# Patient Record
Sex: Female | Born: 1940
Health system: Southern US, Community
[De-identification: ages and names within clinical notes are randomized; demographics above are authoritative.]

## PROBLEM LIST (undated history)

## (undated) DIAGNOSIS — M5134 Other intervertebral disc degeneration, thoracic region: Secondary | ICD-10-CM

## (undated) DIAGNOSIS — M810 Age-related osteoporosis without current pathological fracture: Secondary | ICD-10-CM

## (undated) DIAGNOSIS — R011 Cardiac murmur, unspecified: Secondary | ICD-10-CM

## (undated) DIAGNOSIS — E041 Nontoxic single thyroid nodule: Secondary | ICD-10-CM

## (undated) HISTORY — DX: Age-related osteoporosis without current pathological fracture: M81.0

## (undated) HISTORY — PX: COLONOSCOPY: SHX174

## (undated) HISTORY — DX: Other intervertebral disc degeneration, thoracic region: M51.34

## (undated) HISTORY — DX: Nontoxic single thyroid nodule: E04.1

## (undated) HISTORY — PX: CATARACT EXTRACTION: SUR2

## (undated) HISTORY — DX: Cardiac murmur, unspecified: R01.1

## (undated) HISTORY — PX: OTHER SURGICAL HISTORY: SHX169

---

## 1998-05-16 ENCOUNTER — Other Ambulatory Visit: Admission: RE | Admit: 1998-05-16 | Discharge: 1998-05-16 | Payer: Self-pay | Admitting: Obstetrics and Gynecology

## 2001-07-22 ENCOUNTER — Other Ambulatory Visit: Admission: RE | Admit: 2001-07-22 | Discharge: 2001-07-22 | Payer: Self-pay | Admitting: Obstetrics and Gynecology

## 2004-01-12 ENCOUNTER — Other Ambulatory Visit: Admission: RE | Admit: 2004-01-12 | Discharge: 2004-01-12 | Payer: Self-pay | Admitting: Obstetrics and Gynecology

## 2005-05-16 ENCOUNTER — Other Ambulatory Visit: Admission: RE | Admit: 2005-05-16 | Discharge: 2005-05-16 | Payer: Self-pay | Admitting: Obstetrics and Gynecology

## 2007-12-15 ENCOUNTER — Encounter: Admission: RE | Admit: 2007-12-15 | Discharge: 2007-12-15 | Payer: Self-pay | Admitting: Obstetrics and Gynecology

## 2007-12-15 IMAGING — MG MM DIAGNOSTIC UNILATERAL L
2 series · 2 of 2 positions shown · non-contrast
Comparison: none

DG DIAGNOSTIC UNILATERAL L
CC and MLO view(s) were taken of the left breast.

LEFT BREAST ULTRASOUND
DIGITAL UNILATERAL LEFT DIAGNOSTIC MAMMOGRAM AND LEFT BREAST ULTRASOUND:
CLINICAL DATA: Patient returns after screening study for evaluation of the left breast.

[L CC]
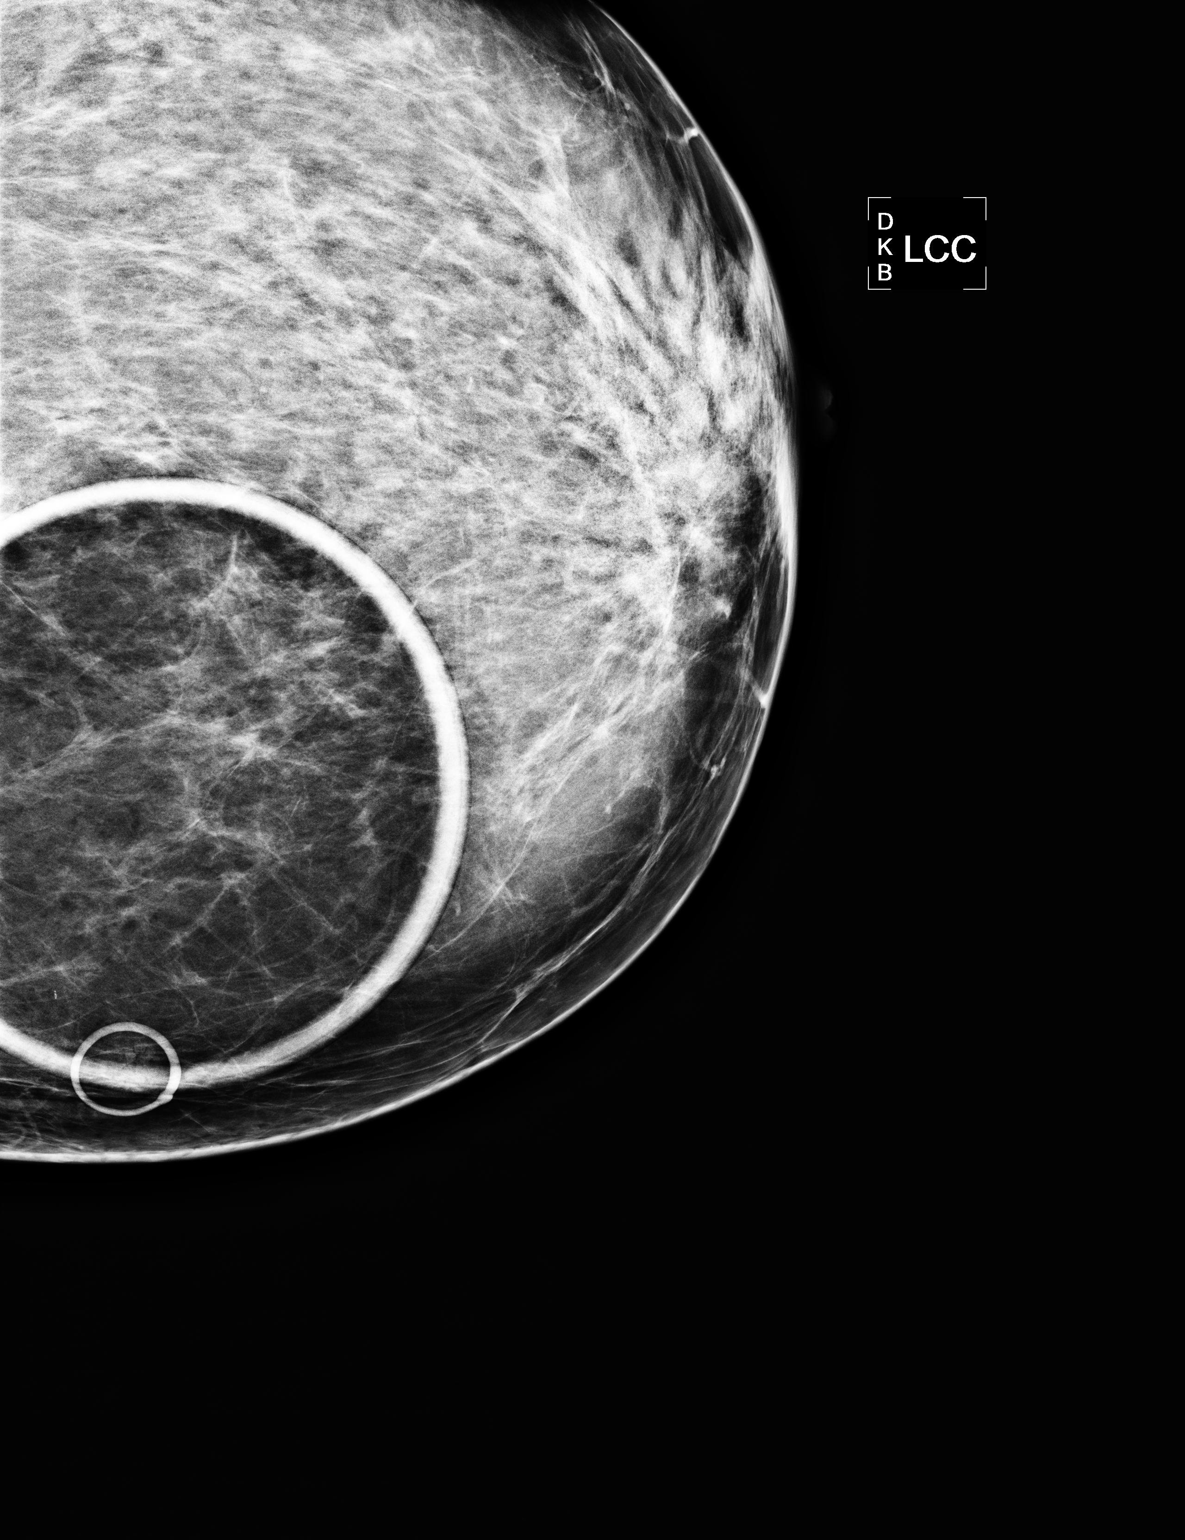

[L RM]
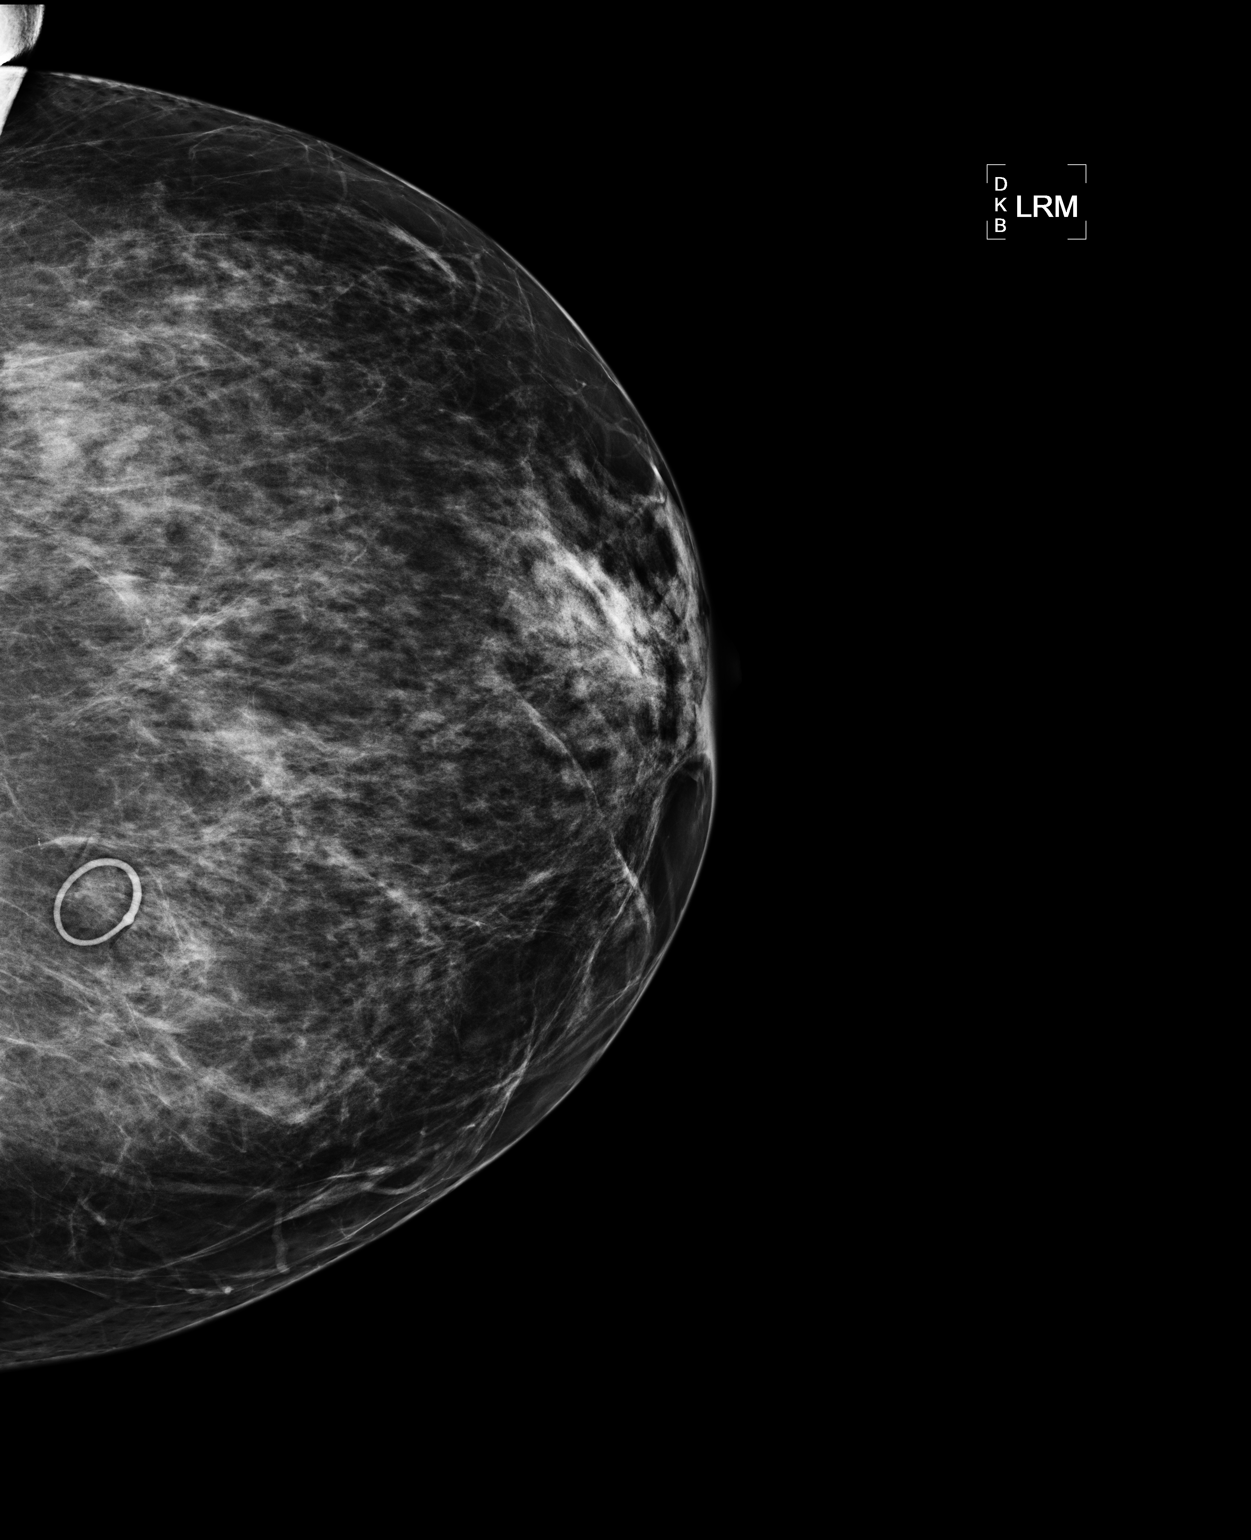

[2 of 2 positions shown; findings below may reference images not displayed]

Additional views of the left breast demonstrate fibrofatty tissue in the medial portion of the left
breast.  No persistent mass or distortion is identified.

On physical exam, I do not palpate an abnormality in the medial aspect of the left breast.  
Ultrasound of the medial quadrants is unremarkable.
IMPRESSION: No ultrasound or mammographic evidence for malignancy.  Annual mammography is recommended

ASSESSMENT: Negative - BI-RADS 1

Screening mammogram of both breasts in 1 year.
,

## 2013-09-23 DIAGNOSIS — M5126 Other intervertebral disc displacement, lumbar region: Secondary | ICD-10-CM | POA: Insufficient documentation

## 2015-02-28 DIAGNOSIS — M51379 Other intervertebral disc degeneration, lumbosacral region without mention of lumbar back pain or lower extremity pain: Secondary | ICD-10-CM | POA: Insufficient documentation

## 2015-09-28 DIAGNOSIS — M533 Sacrococcygeal disorders, not elsewhere classified: Secondary | ICD-10-CM | POA: Insufficient documentation

## 2015-11-22 ENCOUNTER — Other Ambulatory Visit: Payer: Self-pay

## 2015-11-22 DIAGNOSIS — R928 Other abnormal and inconclusive findings on diagnostic imaging of breast: Secondary | ICD-10-CM

## 2015-12-11 ENCOUNTER — Ambulatory Visit
Admission: RE | Admit: 2015-12-11 | Discharge: 2015-12-11 | Disposition: A | Discharge status: Home / Self Care | Payer: PPO | Source: Ambulatory Visit

## 2015-12-11 DIAGNOSIS — Z1231 Encounter for screening mammogram for malignant neoplasm of breast: Secondary | ICD-10-CM | POA: Diagnosis not present

## 2015-12-11 DIAGNOSIS — R928 Other abnormal and inconclusive findings on diagnostic imaging of breast: Secondary | ICD-10-CM

## 2016-01-22 DIAGNOSIS — M79641 Pain in right hand: Secondary | ICD-10-CM | POA: Diagnosis not present

## 2016-01-22 DIAGNOSIS — M1831 Unilateral post-traumatic osteoarthritis of first carpometacarpal joint, right hand: Secondary | ICD-10-CM | POA: Insufficient documentation

## 2016-02-13 DIAGNOSIS — M19079 Primary osteoarthritis, unspecified ankle and foot: Secondary | ICD-10-CM | POA: Diagnosis not present

## 2016-02-16 DIAGNOSIS — M19079 Primary osteoarthritis, unspecified ankle and foot: Secondary | ICD-10-CM | POA: Diagnosis not present

## 2016-02-16 DIAGNOSIS — M25674 Stiffness of right foot, not elsewhere classified: Secondary | ICD-10-CM | POA: Diagnosis not present

## 2016-02-16 DIAGNOSIS — M79671 Pain in right foot: Secondary | ICD-10-CM | POA: Diagnosis not present

## 2016-02-16 DIAGNOSIS — R2689 Other abnormalities of gait and mobility: Secondary | ICD-10-CM | POA: Diagnosis not present

## 2016-02-19 DIAGNOSIS — M79671 Pain in right foot: Secondary | ICD-10-CM | POA: Diagnosis not present

## 2016-02-19 DIAGNOSIS — M19079 Primary osteoarthritis, unspecified ankle and foot: Secondary | ICD-10-CM | POA: Diagnosis not present

## 2016-02-19 DIAGNOSIS — M25674 Stiffness of right foot, not elsewhere classified: Secondary | ICD-10-CM | POA: Diagnosis not present

## 2016-02-19 DIAGNOSIS — R2689 Other abnormalities of gait and mobility: Secondary | ICD-10-CM | POA: Diagnosis not present

## 2016-02-21 DIAGNOSIS — R2689 Other abnormalities of gait and mobility: Secondary | ICD-10-CM | POA: Diagnosis not present

## 2016-02-21 DIAGNOSIS — M19079 Primary osteoarthritis, unspecified ankle and foot: Secondary | ICD-10-CM | POA: Diagnosis not present

## 2016-02-21 DIAGNOSIS — M79671 Pain in right foot: Secondary | ICD-10-CM | POA: Diagnosis not present

## 2016-02-21 DIAGNOSIS — M25674 Stiffness of right foot, not elsewhere classified: Secondary | ICD-10-CM | POA: Diagnosis not present

## 2016-02-26 DIAGNOSIS — M19079 Primary osteoarthritis, unspecified ankle and foot: Secondary | ICD-10-CM | POA: Diagnosis not present

## 2016-02-26 DIAGNOSIS — R2689 Other abnormalities of gait and mobility: Secondary | ICD-10-CM | POA: Diagnosis not present

## 2016-02-26 DIAGNOSIS — M25674 Stiffness of right foot, not elsewhere classified: Secondary | ICD-10-CM | POA: Diagnosis not present

## 2016-02-26 DIAGNOSIS — M79671 Pain in right foot: Secondary | ICD-10-CM | POA: Diagnosis not present

## 2016-03-04 DIAGNOSIS — M79671 Pain in right foot: Secondary | ICD-10-CM | POA: Diagnosis not present

## 2016-03-04 DIAGNOSIS — M19079 Primary osteoarthritis, unspecified ankle and foot: Secondary | ICD-10-CM | POA: Diagnosis not present

## 2016-03-04 DIAGNOSIS — M25674 Stiffness of right foot, not elsewhere classified: Secondary | ICD-10-CM | POA: Diagnosis not present

## 2016-03-04 DIAGNOSIS — R2689 Other abnormalities of gait and mobility: Secondary | ICD-10-CM | POA: Diagnosis not present

## 2016-03-12 DIAGNOSIS — M79671 Pain in right foot: Secondary | ICD-10-CM | POA: Diagnosis not present

## 2016-03-12 DIAGNOSIS — M19079 Primary osteoarthritis, unspecified ankle and foot: Secondary | ICD-10-CM | POA: Diagnosis not present

## 2016-03-12 DIAGNOSIS — R2689 Other abnormalities of gait and mobility: Secondary | ICD-10-CM | POA: Diagnosis not present

## 2016-03-12 DIAGNOSIS — M25674 Stiffness of right foot, not elsewhere classified: Secondary | ICD-10-CM | POA: Diagnosis not present

## 2016-03-19 DIAGNOSIS — M25674 Stiffness of right foot, not elsewhere classified: Secondary | ICD-10-CM | POA: Diagnosis not present

## 2016-03-19 DIAGNOSIS — M79671 Pain in right foot: Secondary | ICD-10-CM | POA: Diagnosis not present

## 2016-03-19 DIAGNOSIS — M19079 Primary osteoarthritis, unspecified ankle and foot: Secondary | ICD-10-CM | POA: Diagnosis not present

## 2016-03-19 DIAGNOSIS — R2689 Other abnormalities of gait and mobility: Secondary | ICD-10-CM | POA: Diagnosis not present

## 2016-03-25 DIAGNOSIS — R2689 Other abnormalities of gait and mobility: Secondary | ICD-10-CM | POA: Diagnosis not present

## 2016-03-25 DIAGNOSIS — M79671 Pain in right foot: Secondary | ICD-10-CM | POA: Diagnosis not present

## 2016-03-25 DIAGNOSIS — M25674 Stiffness of right foot, not elsewhere classified: Secondary | ICD-10-CM | POA: Diagnosis not present

## 2016-03-25 DIAGNOSIS — M19079 Primary osteoarthritis, unspecified ankle and foot: Secondary | ICD-10-CM | POA: Diagnosis not present

## 2016-04-05 DIAGNOSIS — R2689 Other abnormalities of gait and mobility: Secondary | ICD-10-CM | POA: Diagnosis not present

## 2016-04-05 DIAGNOSIS — M19079 Primary osteoarthritis, unspecified ankle and foot: Secondary | ICD-10-CM | POA: Diagnosis not present

## 2016-04-05 DIAGNOSIS — M79671 Pain in right foot: Secondary | ICD-10-CM | POA: Diagnosis not present

## 2016-04-05 DIAGNOSIS — M25674 Stiffness of right foot, not elsewhere classified: Secondary | ICD-10-CM | POA: Diagnosis not present

## 2016-09-02 DIAGNOSIS — Z0000 Encounter for general adult medical examination without abnormal findings: Secondary | ICD-10-CM | POA: Diagnosis not present

## 2016-09-02 DIAGNOSIS — E042 Nontoxic multinodular goiter: Secondary | ICD-10-CM | POA: Diagnosis not present

## 2016-09-02 DIAGNOSIS — M810 Age-related osteoporosis without current pathological fracture: Secondary | ICD-10-CM | POA: Diagnosis not present

## 2016-10-16 DIAGNOSIS — H524 Presbyopia: Secondary | ICD-10-CM | POA: Diagnosis not present

## 2016-10-16 DIAGNOSIS — H2513 Age-related nuclear cataract, bilateral: Secondary | ICD-10-CM | POA: Diagnosis not present

## 2016-11-28 DIAGNOSIS — S22000A Wedge compression fracture of unspecified thoracic vertebra, initial encounter for closed fracture: Secondary | ICD-10-CM | POA: Diagnosis not present

## 2016-12-11 ENCOUNTER — Other Ambulatory Visit: Payer: Self-pay | Admitting: Neurosurgery

## 2016-12-11 DIAGNOSIS — S22000A Wedge compression fracture of unspecified thoracic vertebra, initial encounter for closed fracture: Secondary | ICD-10-CM

## 2016-12-16 ENCOUNTER — Ambulatory Visit
Admission: RE | Admit: 2016-12-16 | Discharge: 2016-12-16 | Disposition: A | Discharge status: Home / Self Care | Payer: PPO | Source: Ambulatory Visit | Attending: Neurosurgery | Admitting: Neurosurgery

## 2016-12-16 DIAGNOSIS — M545 Low back pain: Secondary | ICD-10-CM | POA: Diagnosis not present

## 2016-12-16 DIAGNOSIS — S22000A Wedge compression fracture of unspecified thoracic vertebra, initial encounter for closed fracture: Secondary | ICD-10-CM

## 2016-12-16 DIAGNOSIS — S22080A Wedge compression fracture of T11-T12 vertebra, initial encounter for closed fracture: Secondary | ICD-10-CM | POA: Diagnosis not present

## 2016-12-16 IMAGING — CT CT T SPINE W/O CM
1 series · 12 of 14 positions shown, 15 images · non-contrast
Comparison: Radiographs dated [DATE]

CLINICAL DATA: Back pain.  Compression fracture of T11.

EXAM:
CT THORACIC SPINE WITHOUT CONTRAST
TECHNIQUE: Multidetector CT images of the thoracic were obtained using the
standard protocol without intravenous contrast.

[Series 3: t spine soft · axial · 0.29mm/px · z∈[-319,-43]mm · 12 of 110 slices shown, 15 images]
[im 9/110  soft-tissue]
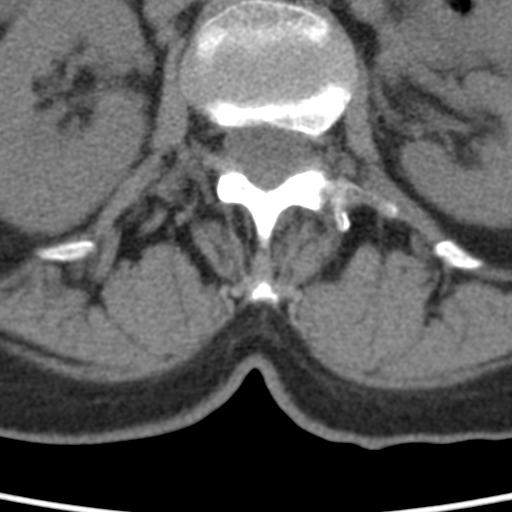
[im 9/110  bone]
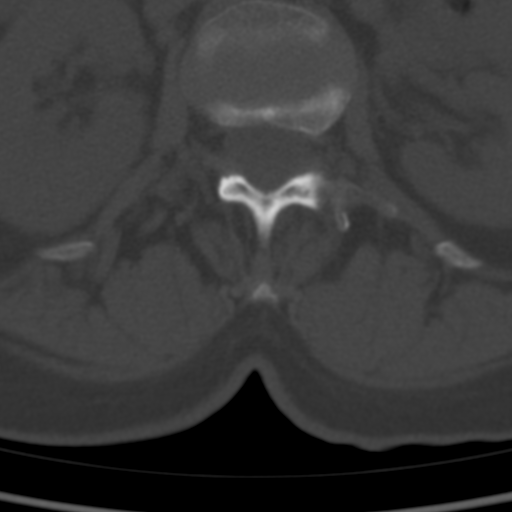
[im 17/110  bone]
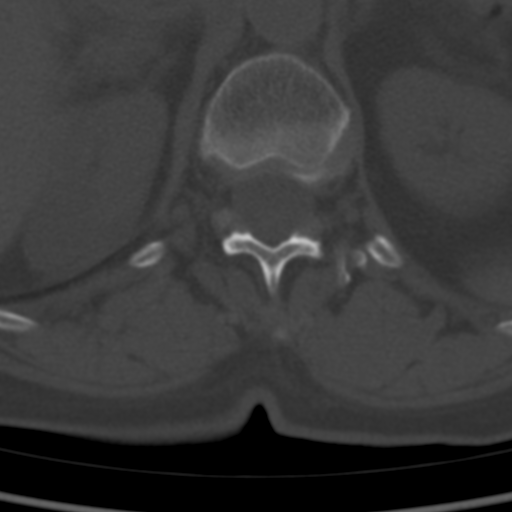
[im 26/110  bone]
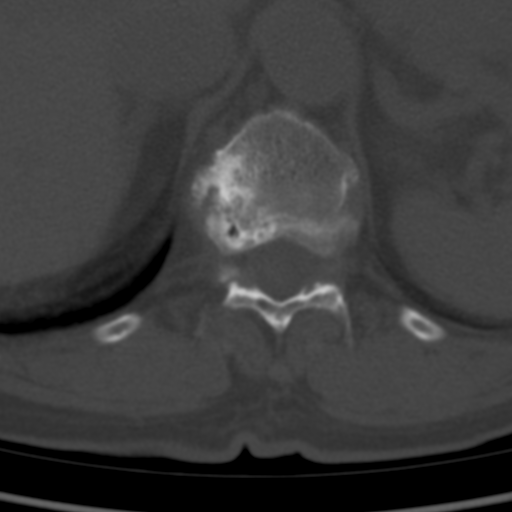
[im 34/110  bone]
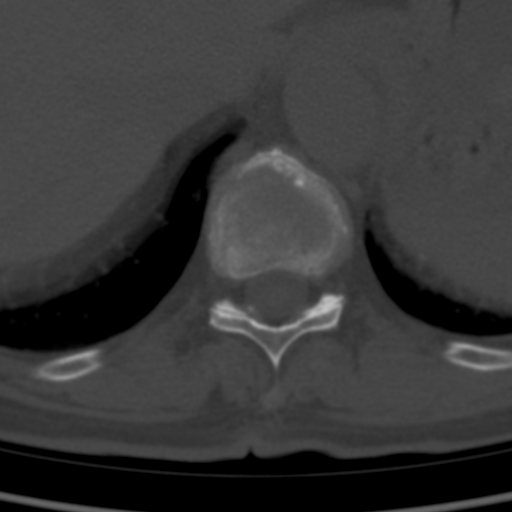
[im 42/110  soft-tissue]
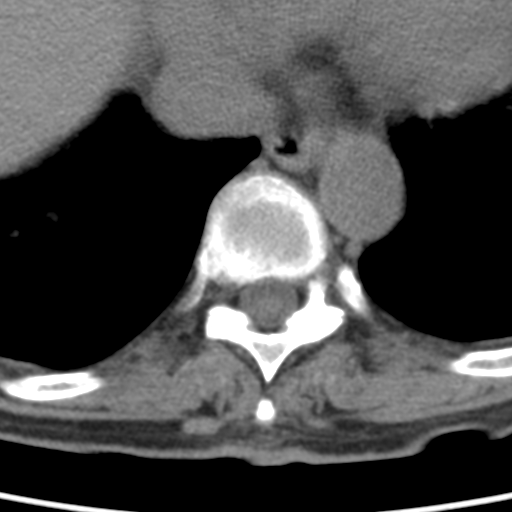
[im 42/110  bone]
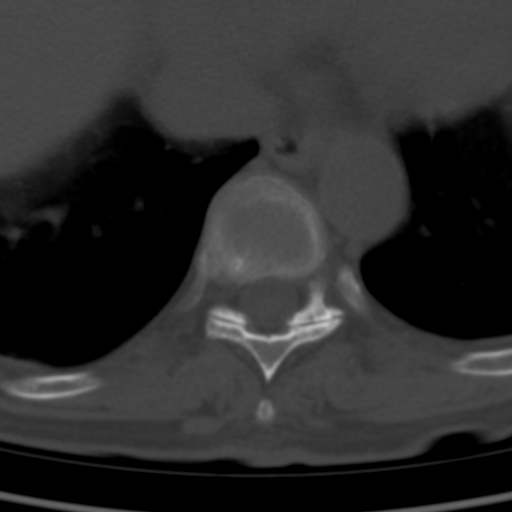
[im 51/110  bone]
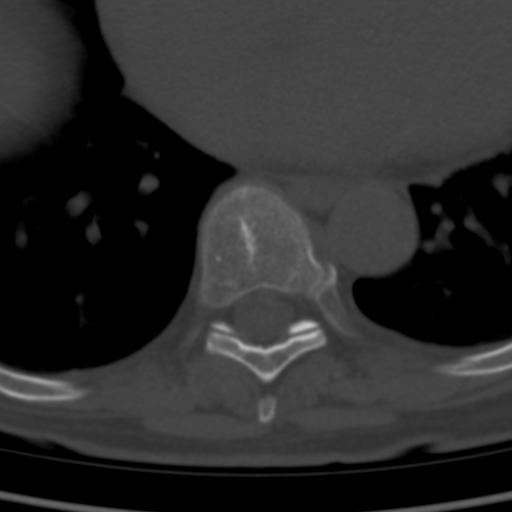
[im 59/110  bone]
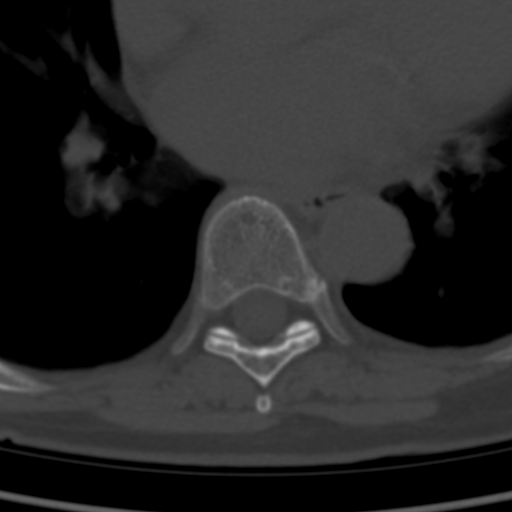
[im 68/110  bone]
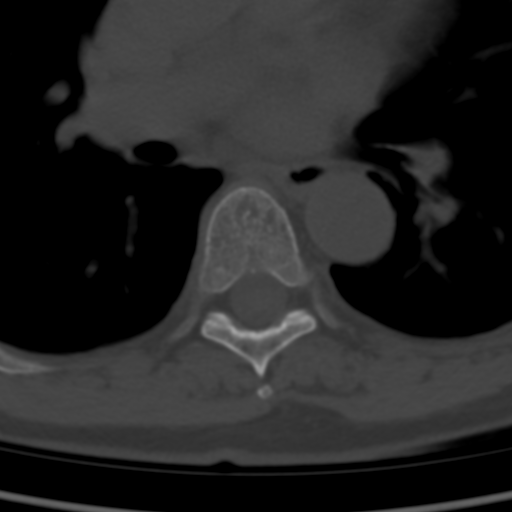
[im 76/110  soft-tissue]
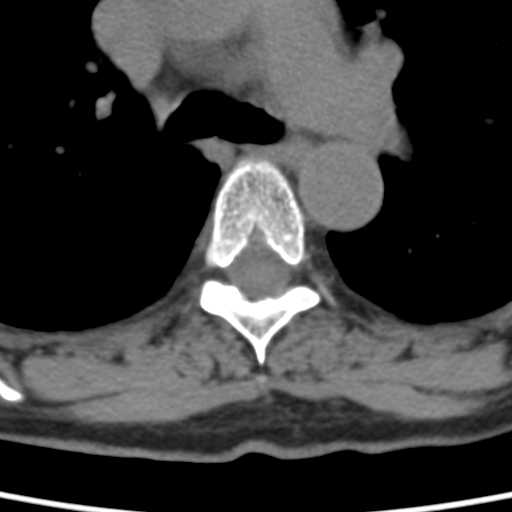
[im 76/110  bone]
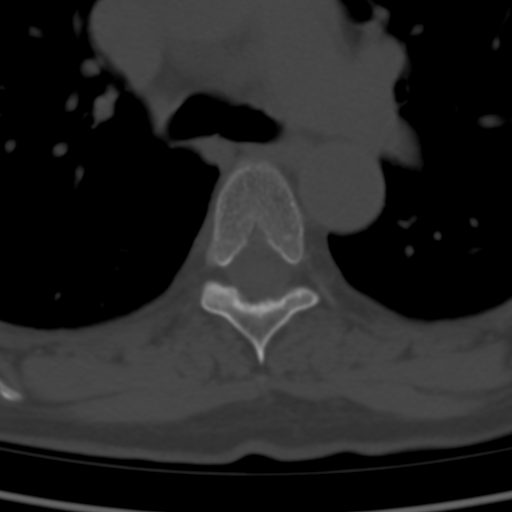
[im 84/110  bone]
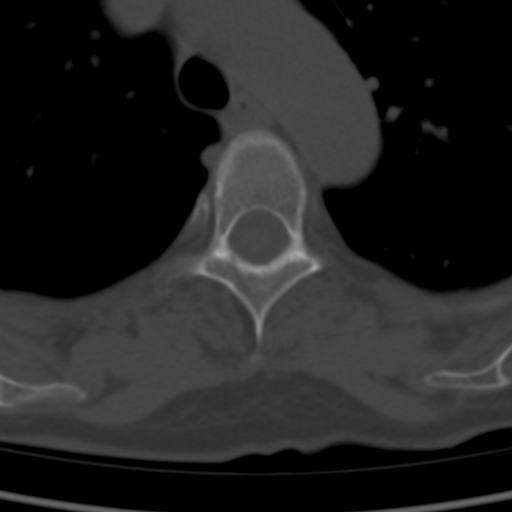
[im 93/110  bone]
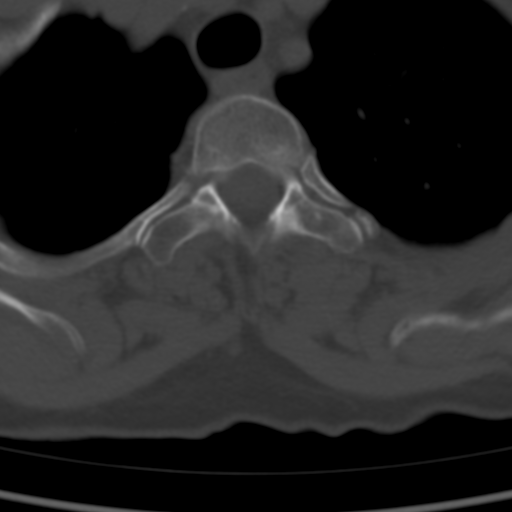
[im 101/110  bone]
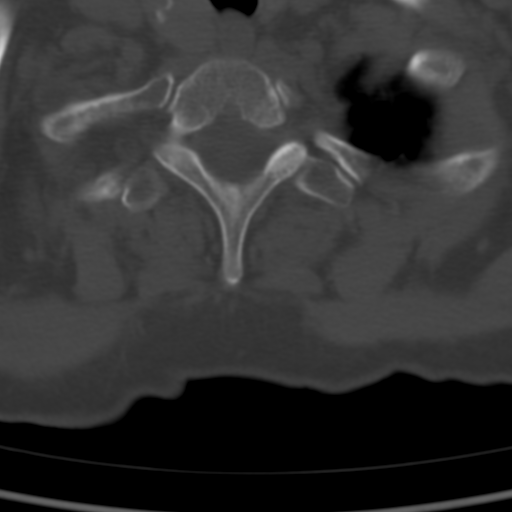

[12 of 14 positions shown; findings below may reference images not displayed]

FINDINGS: Alignment: Slight retrolisthesis of T11 on T12, 2.5 mm.

Vertebrae: Old compression fractures T11, unchanged since
radiographs of [DATE]. Severe degenerative disc disease with
erosions of the vertebral endplates at T11-12 asymmetric to the
right of midline.

Paraspinal and other soft tissues: Normal.

Disc levels: T1-2 through T4-5: Normal.

C5-6: Small central disc protrusion with no neural impingement.

T6-7:  Small central disc protrusion with no neural impingement.

T7-8: Tiny central disc bulge with no neural impingement.

T9-10: Prominent disc extrusion central and slightly asymmetric to
the left slightly distorting the ventral aspect of the left side of
the spinal cord. No spinal stenosis.

T9-10:  Normal.

T10-11: Old compression fracture of the superior aspect of T11. No
disc protrusion. Slight protrusion of the posterosuperior aspect of
the T11 vertebral body into the spinal canal, 2 mm. No neural
impingement.

T11-12: Severe degenerative disc disease with extensive degenerative
changes of the vertebral endplates asymmetric to the right. 2.5 mm
retrolisthesis of T11 on T12. No disc protrusion or bulging.

T12-L1 and L1-2:  Negative.
IMPRESSION: 1. Old healed compression fracture of T11.
2. Severe degenerative disc disease at T11-12 without neural
impingement.
3. Soft disc extrusion at T8-9 central and slightly to the left
slightly indenting the ventral aspect of the spinal cord at that
level.
4. Tiny central disc protrusions at T5-6 and T6-7.

## 2016-12-16 IMAGING — CT CT L SPINE W/O CM
1 of 6 series · 6 of 14 positions shown, 8 images · non-contrast
Comparison: Radiographs dated [DATE]

CLINICAL DATA: Low back pain for several years. T11 compression
fracture.

EXAM:
CT LUMBAR SPINE WITHOUT CONTRAST
TECHNIQUE: Multidetector CT imaging of the lumbar spine was performed without
intravenous contrast administration. Multiplanar CT image
reconstructions were also generated.

[Series 4: l spine bone · axial · 0.30mm/px · z∈[-459,-294]mm · 6 of 79 slices shown, 8 images]
[im 12/79  soft-tissue]
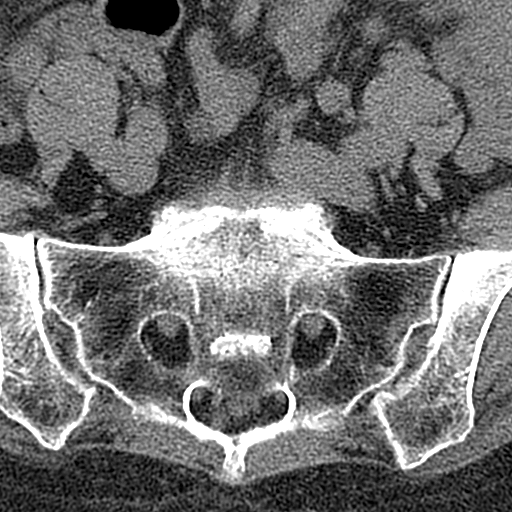
[im 12/79  bone]
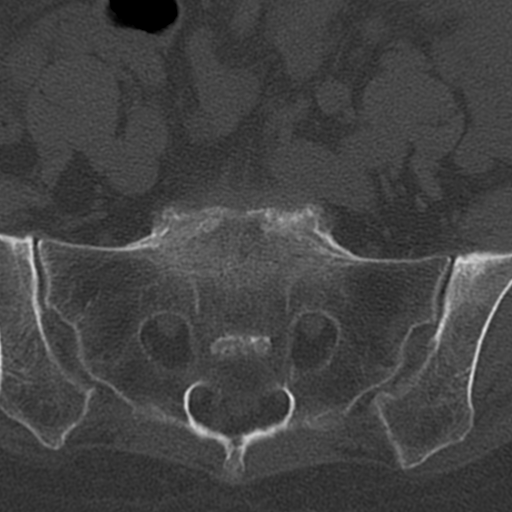
[im 23/79  bone]
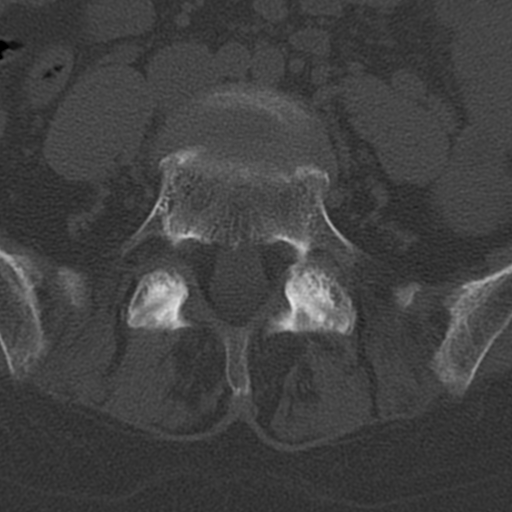
[im 34/79  bone]
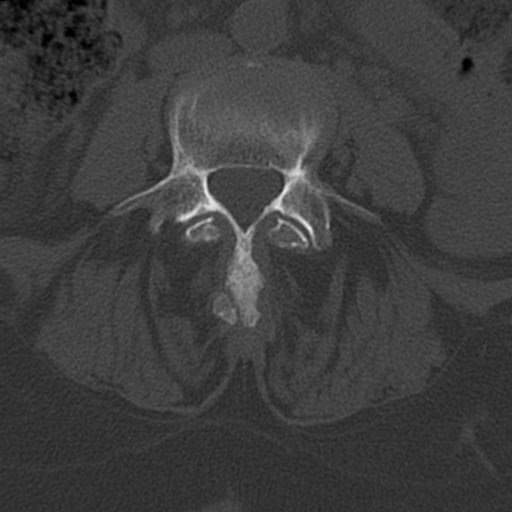
[im 45/79  bone]
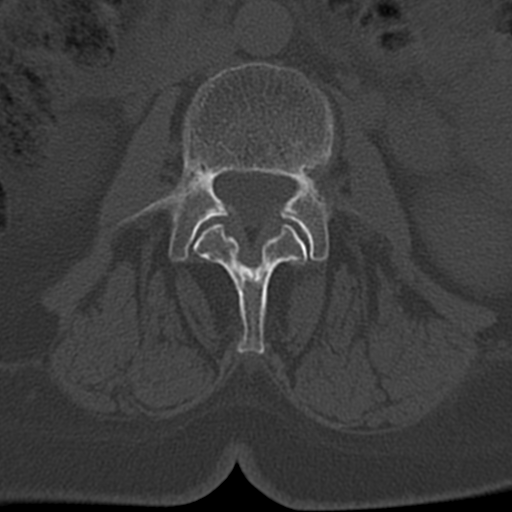
[im 56/79  soft-tissue]
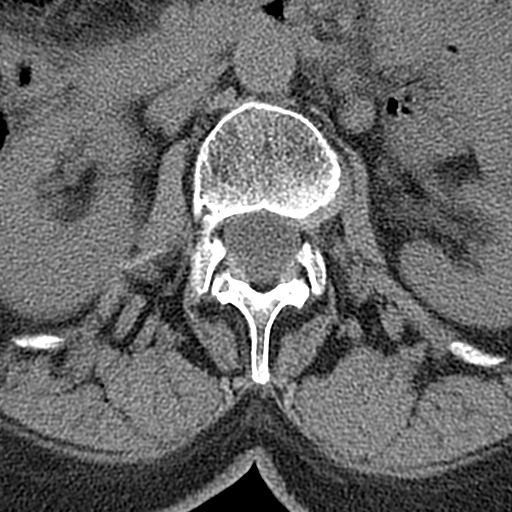
[im 56/79  bone]
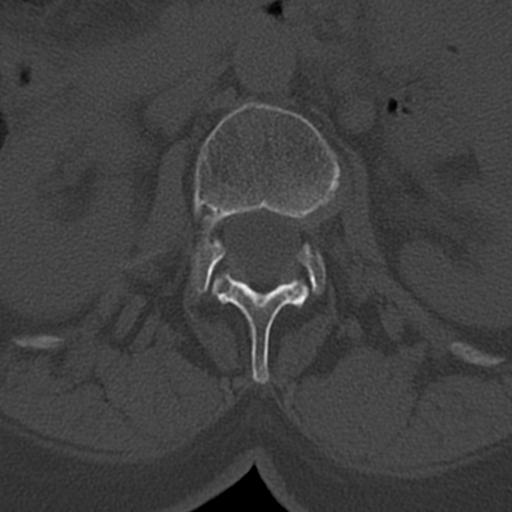
[im 67/79  bone]
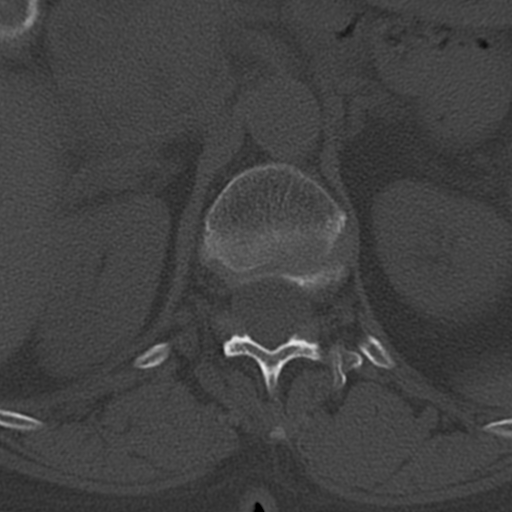

[6 of 14 positions shown; findings below may reference images not displayed]

FINDINGS: Segmentation: Normal.

Alignment: 10 mm spondylolisthesis of L4 on L5 secondary to severe
bilateral facet arthritis.

Vertebrae: No fractures or bone destruction.

Paraspinal and other soft tissues: Multiple calcified gallstones.
2.9 cm low-density lesion in the upper pole of the right kidney
consistent with a cyst.

Disc levels: T12-L1 and L1-2:  Normal.

L2-3: Normal disc. Degenerative changes between the spinous
processes of L2 and L3.

L3-4: Normal disc. Slight degenerative changes between the spinous
processes of L3 and L4.

L4-5: 1 cm spondylolisthesis with a broad-based protrusion of the
uncovered disc with severe bilateral facet arthritis with ligamentum
flavum hypertrophy. These combine to create severe spinal stenosis.
The thecal sac is almost obliterated at the level of the disc. This
is best seen on images 52 and 53. Degenerative changes between the
spinous processes of L4 and L5.

L5-S1: Severe disc degeneration with a vacuum phenomena and. Small
broad-based disc bulge with no neural impingement. Widely patent
neural foramina.
IMPRESSION: 1. Severe spinal stenosis at L4-5 secondary to grade 1
spondylolisthesis and a broad-based disc protrusion and severe
bilateral facet arthritis.
2. Degenerative changes between the spinous processes at L2-3, L3-4,
and L4-5.

## 2016-12-20 DIAGNOSIS — S22000A Wedge compression fracture of unspecified thoracic vertebra, initial encounter for closed fracture: Secondary | ICD-10-CM | POA: Diagnosis not present

## 2016-12-20 DIAGNOSIS — M4316 Spondylolisthesis, lumbar region: Secondary | ICD-10-CM | POA: Diagnosis not present

## 2016-12-20 DIAGNOSIS — M5137 Other intervertebral disc degeneration, lumbosacral region: Secondary | ICD-10-CM | POA: Diagnosis not present

## 2017-01-13 ENCOUNTER — Other Ambulatory Visit: Payer: Self-pay | Admitting: Family Medicine

## 2017-01-13 DIAGNOSIS — Z1231 Encounter for screening mammogram for malignant neoplasm of breast: Secondary | ICD-10-CM

## 2017-02-10 ENCOUNTER — Ambulatory Visit
Admission: RE | Admit: 2017-02-10 | Discharge: 2017-02-10 | Disposition: A | Discharge status: Home / Self Care | Payer: PPO | Source: Ambulatory Visit | Attending: Family Medicine | Admitting: Family Medicine

## 2017-02-10 DIAGNOSIS — Z1231 Encounter for screening mammogram for malignant neoplasm of breast: Secondary | ICD-10-CM | POA: Diagnosis not present

## 2017-02-13 DIAGNOSIS — J0100 Acute maxillary sinusitis, unspecified: Secondary | ICD-10-CM | POA: Diagnosis not present

## 2017-05-19 DIAGNOSIS — Z8041 Family history of malignant neoplasm of ovary: Secondary | ICD-10-CM | POA: Diagnosis not present

## 2017-05-19 DIAGNOSIS — Z8059 Family history of malignant neoplasm of other urinary tract organ: Secondary | ICD-10-CM | POA: Diagnosis not present

## 2017-05-19 DIAGNOSIS — Z803 Family history of malignant neoplasm of breast: Secondary | ICD-10-CM | POA: Diagnosis not present

## 2017-05-19 DIAGNOSIS — Z806 Family history of leukemia: Secondary | ICD-10-CM | POA: Diagnosis not present

## 2017-05-27 DIAGNOSIS — B355 Tinea imbricata: Secondary | ICD-10-CM | POA: Diagnosis not present

## 2017-07-21 DIAGNOSIS — J324 Chronic pansinusitis: Secondary | ICD-10-CM | POA: Diagnosis not present

## 2017-08-13 DIAGNOSIS — D1801 Hemangioma of skin and subcutaneous tissue: Secondary | ICD-10-CM | POA: Diagnosis not present

## 2017-08-13 DIAGNOSIS — L821 Other seborrheic keratosis: Secondary | ICD-10-CM | POA: Diagnosis not present

## 2017-08-13 DIAGNOSIS — D692 Other nonthrombocytopenic purpura: Secondary | ICD-10-CM | POA: Diagnosis not present

## 2017-10-20 DIAGNOSIS — Z79899 Other long term (current) drug therapy: Secondary | ICD-10-CM | POA: Diagnosis not present

## 2017-10-20 DIAGNOSIS — Z6825 Body mass index (BMI) 25.0-25.9, adult: Secondary | ICD-10-CM | POA: Diagnosis not present

## 2017-10-20 DIAGNOSIS — H5213 Myopia, bilateral: Secondary | ICD-10-CM | POA: Diagnosis not present

## 2017-10-20 DIAGNOSIS — M810 Age-related osteoporosis without current pathological fracture: Secondary | ICD-10-CM | POA: Diagnosis not present

## 2017-10-20 DIAGNOSIS — H2513 Age-related nuclear cataract, bilateral: Secondary | ICD-10-CM | POA: Diagnosis not present

## 2017-10-20 DIAGNOSIS — M545 Low back pain: Secondary | ICD-10-CM | POA: Diagnosis not present

## 2017-10-20 DIAGNOSIS — Z0000 Encounter for general adult medical examination without abnormal findings: Secondary | ICD-10-CM | POA: Diagnosis not present

## 2017-10-20 DIAGNOSIS — M549 Dorsalgia, unspecified: Secondary | ICD-10-CM | POA: Diagnosis not present

## 2017-11-16 DIAGNOSIS — R300 Dysuria: Secondary | ICD-10-CM | POA: Diagnosis not present

## 2017-11-16 DIAGNOSIS — N3090 Cystitis, unspecified without hematuria: Secondary | ICD-10-CM | POA: Diagnosis not present

## 2017-11-22 DIAGNOSIS — R109 Unspecified abdominal pain: Secondary | ICD-10-CM | POA: Diagnosis not present

## 2017-11-22 DIAGNOSIS — N200 Calculus of kidney: Secondary | ICD-10-CM | POA: Diagnosis not present

## 2017-11-28 DIAGNOSIS — N201 Calculus of ureter: Secondary | ICD-10-CM | POA: Diagnosis not present

## 2017-11-28 DIAGNOSIS — N132 Hydronephrosis with renal and ureteral calculous obstruction: Secondary | ICD-10-CM | POA: Diagnosis not present

## 2017-12-22 DIAGNOSIS — N202 Calculus of kidney with calculus of ureter: Secondary | ICD-10-CM | POA: Diagnosis not present

## 2017-12-24 ENCOUNTER — Encounter (INDEPENDENT_AMBULATORY_CARE_PROVIDER_SITE_OTHER): Payer: Self-pay | Admitting: Orthopedic Surgery

## 2017-12-24 ENCOUNTER — Ambulatory Visit (INDEPENDENT_AMBULATORY_CARE_PROVIDER_SITE_OTHER): Payer: PPO | Admitting: Orthopedic Surgery

## 2017-12-24 ENCOUNTER — Ambulatory Visit (INDEPENDENT_AMBULATORY_CARE_PROVIDER_SITE_OTHER): Payer: PPO

## 2017-12-24 DIAGNOSIS — G8929 Other chronic pain: Secondary | ICD-10-CM

## 2017-12-24 DIAGNOSIS — M25511 Pain in right shoulder: Secondary | ICD-10-CM

## 2017-12-27 ENCOUNTER — Encounter (INDEPENDENT_AMBULATORY_CARE_PROVIDER_SITE_OTHER): Payer: Self-pay | Admitting: Orthopedic Surgery

## 2017-12-27 NOTE — Progress Notes (Signed)
Office Visit Note   Patient: Stephanie Mckee           Date of Birth: 09-Nov-1941           MRN: [id number] Visit Date: 12/24/2017 Requested by: No referring provider defined for this encounter. PCP: Greig Right, MD  Subjective: Chief Complaint  Patient presents with  . Right Shoulder - Pain    HPI: [name] is a patient who presents for evaluation of bilateral shoulder pain right worse than left.  Started a year ago with water aerobics.  Patient states the pain comes and goes.  Usually depends on motion and activity.  She is right-hand dominant.  The pain does not wake her from sleep at night.  At times she reports some decreased range of motion due to the pain.  Most of the pain is in the biceps area.  She reports some radiation of the pain to the elbow but no numbness and tingling and no neck pain.  She takes Celebrex for low back pain.  She states her shoulder pain is getting a little bit worse.  He states it is a grabbing type pain with the car door.              ROS: All systems reviewed are negative as they relate to the chief complaint within the history of present illness.  Patient denies  fevers or chills.   Assessment & Plan: Visit Diagnoses:  1. Chronic right shoulder pain     Plan: Impression is right shoulder pain with evidence of early arthritis.  Ultrasound examination of the rotator cuff demonstrates no large tears.  She may have a little partial-thickness tearing of the supraspinatus but nothing large seen on ultrasound examination.  For now I think that options include treatment with injections and conservative treatment.  Eventually she may need to get further imaging if she is considering operative intervention.  I will see her back as needed.  Follow-Up Instructions: Return if symptoms worsen or fail to improve.   Orders:  Orders Placed This Encounter  Procedures  . XR Shoulder Right   No orders of the defined types were placed in this encounter.     Procedures: No procedures performed   Clinical Data: No additional findings.  Objective: Vital Signs: There were no vitals taken for this visit.  Physical Exam:   Constitutional: Patient appears well-developed HEENT:  Head: Normocephalic Eyes:EOM are normal Neck: Normal range of motion Cardiovascular: Normal rate Pulmonary/chest: Effort normal Neurologic: Patient is alert Skin: Skin is warm Psychiatric: Patient has normal mood and affect    Ortho Exam: Orthopedic exam demonstrates good cervical spine range of motion.  No paresthesias C5-T1.  Reflexes symmetric.  Radial pulse intact.  Deltoid demonstrates good function.  No restriction of external rotation at 15 degrees of abduction.  Rotator cuff strength is intact to infraspinatus supraspinatus and subscap muscle testing.  Not much in the way of coarse grinding or crepitus with passive range of motion.  Specialty Comments:  No specialty comments available.  Imaging: No results found.   PMFS History: There are no active problems to display for this patient.  History reviewed. No pertinent past medical history.  Family History  Problem Relation Age of Onset  . Breast cancer Maternal Aunt   . Breast cancer Daughter     History reviewed. No pertinent surgical history. Social History   Occupational History  . Not on file  Tobacco Use  . Smoking status: Not on file  Substance and Sexual Activity  . Alcohol use: Not on file  . Drug use: Not on file  . Sexual activity: Not on file

## 2018-03-17 ENCOUNTER — Other Ambulatory Visit: Payer: Self-pay | Admitting: Family Medicine

## 2018-03-17 DIAGNOSIS — Z1231 Encounter for screening mammogram for malignant neoplasm of breast: Secondary | ICD-10-CM

## 2018-04-06 DIAGNOSIS — S83402A Sprain of unspecified collateral ligament of left knee, initial encounter: Secondary | ICD-10-CM | POA: Diagnosis not present

## 2018-04-06 DIAGNOSIS — S80211A Abrasion, right knee, initial encounter: Secondary | ICD-10-CM | POA: Diagnosis not present

## 2018-04-07 ENCOUNTER — Ambulatory Visit
Admission: RE | Admit: 2018-04-07 | Discharge: 2018-04-07 | Disposition: A | Discharge status: Home / Self Care | Payer: PPO | Source: Ambulatory Visit | Attending: Family Medicine | Admitting: Family Medicine

## 2018-04-07 DIAGNOSIS — Z1231 Encounter for screening mammogram for malignant neoplasm of breast: Secondary | ICD-10-CM | POA: Diagnosis not present

## 2018-04-20 DIAGNOSIS — R229 Localized swelling, mass and lump, unspecified: Secondary | ICD-10-CM | POA: Diagnosis not present

## 2018-04-21 DIAGNOSIS — M4120 Other idiopathic scoliosis, site unspecified: Secondary | ICD-10-CM | POA: Diagnosis not present

## 2018-04-21 DIAGNOSIS — M533 Sacrococcygeal disorders, not elsewhere classified: Secondary | ICD-10-CM | POA: Diagnosis not present

## 2018-04-30 DIAGNOSIS — R1031 Right lower quadrant pain: Secondary | ICD-10-CM | POA: Diagnosis not present

## 2018-06-08 ENCOUNTER — Other Ambulatory Visit: Payer: Self-pay | Admitting: Neurosurgery

## 2018-06-08 DIAGNOSIS — M533 Sacrococcygeal disorders, not elsewhere classified: Secondary | ICD-10-CM

## 2018-06-18 ENCOUNTER — Ambulatory Visit
Admission: RE | Admit: 2018-06-18 | Discharge: 2018-06-18 | Disposition: A | Discharge status: Home / Self Care | Payer: PPO | Source: Ambulatory Visit | Attending: Neurosurgery | Admitting: Neurosurgery

## 2018-06-18 DIAGNOSIS — M533 Sacrococcygeal disorders, not elsewhere classified: Secondary | ICD-10-CM

## 2018-06-18 IMAGING — XA DG FLUORO GUIDE SPINAL/SI JT INJ*R*
1 series · 1 of 1 positions shown · non-contrast
Comparison: none

CLINICAL DATA: Sacroiliac joint dysfunction. Right buttock/SI
region pain. Intermittent right leg numbness.

[Series 1: ortho standard · 1 of 1 slices shown]
[im 1/1]
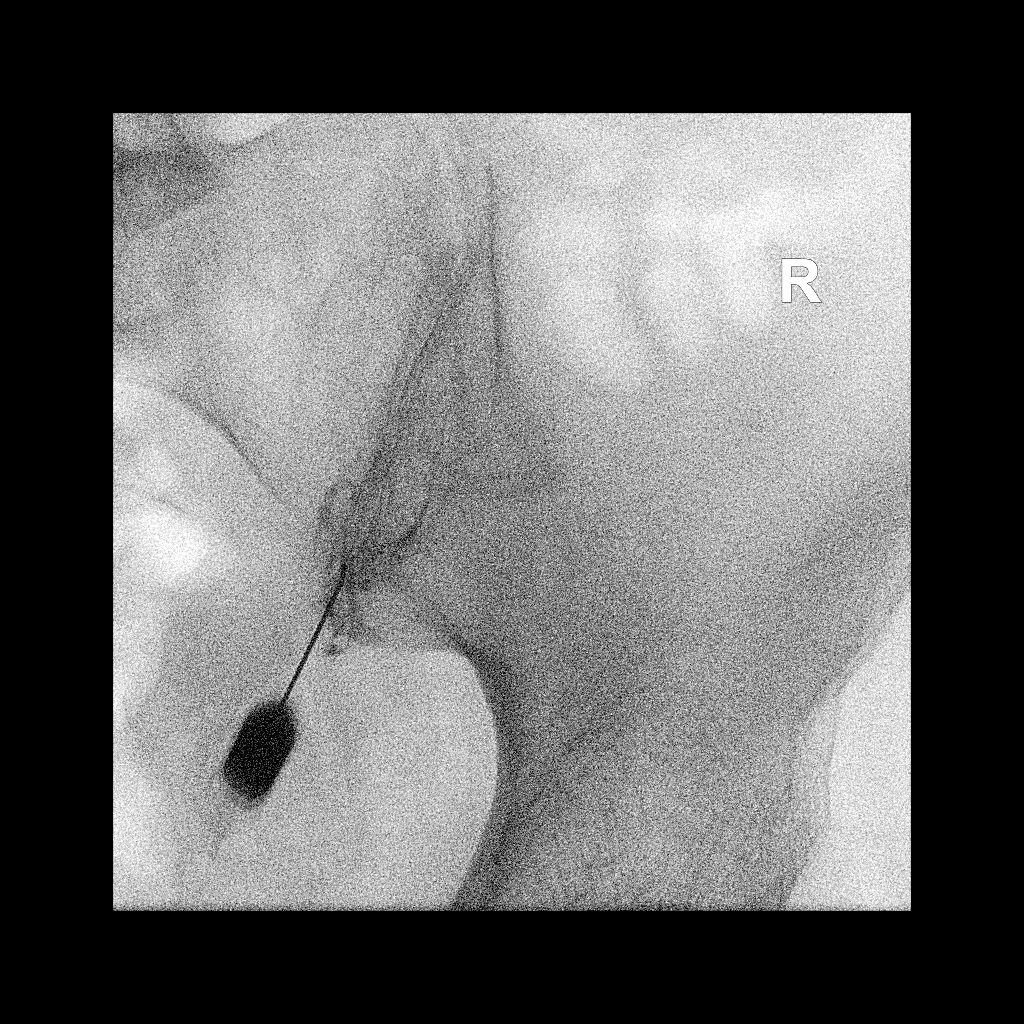

[1 of 1 positions shown; findings below may reference images not displayed]

FLUOROSCOPY TIME:  Radiation Exposure Index (as provided by the
fluoroscopic device): 23.62 microGray*m^2

Fluoroscopy Time (in minutes and seconds):  9 seconds

PROCEDURE:
FLUOROSCOPICALLY GUIDED RIGHT SI JOINT INJECTION.

After a thorough discussion of risks and benefits of the procedure,
including bleeding, infection, injury to nerves, blood vessels, and
adjacent structures, verbal and written consent was obtained.
Specific risks of the procedure included
nondiagnostic/nontherapeutic injection and nontarget injection. The
patient was placed prone on the fluoroscopy table and localization
was performed over the sacrum. Target site marked using fluoroscopic
guidance. The skin was prepped and draped in the usual sterile
fashion using Betadine soap.

After local anesthesia with 1% lidocaine without epinephrine and
subsequent deep anesthesia, a 3.5 inch 22 gauge spinal needle was
advanced into the right SI joint. Injection of 0.5 mL of Isovue-M
200 confirmed intra-articular placement. No vascular uptake was
present. Subsequently, 120 mg of Depo-Medrol and 1 mL of 1%
lidocaine were injected into the SI joint. The needle was removed
and a sterile dressing applied.

No complications were observed. The patient was observed and
released under the care of a driver after 30 minutes.

Preprocedure pain score:  [DATE]

Postprocedure pain score: [DATE]
IMPRESSION: Successful fluoroscopically guided right SI joint injection.

## 2018-06-18 MED ORDER — METHYLPREDNISOLONE ACETATE 40 MG/ML INJ SUSP (RADIOLOG
120.0000 mg | Freq: Once | INTRAMUSCULAR | Status: AC
  Administered 2018-06-18: 120 mg via INTRA_ARTICULAR

## 2018-06-18 MED ORDER — IOPAMIDOL (ISOVUE-M 200) INJECTION 41%
1.0000 mL | Freq: Once | INTRAMUSCULAR | Status: AC
  Administered 2018-06-18: 1 mL via INTRA_ARTICULAR

## 2018-06-18 NOTE — Discharge Instructions (Signed)

## 2018-08-19 DIAGNOSIS — H25811 Combined forms of age-related cataract, right eye: Secondary | ICD-10-CM | POA: Diagnosis not present

## 2018-09-03 DIAGNOSIS — H52201 Unspecified astigmatism, right eye: Secondary | ICD-10-CM | POA: Diagnosis not present

## 2018-09-03 DIAGNOSIS — H25811 Combined forms of age-related cataract, right eye: Secondary | ICD-10-CM | POA: Diagnosis not present

## 2018-09-03 DIAGNOSIS — H2511 Age-related nuclear cataract, right eye: Secondary | ICD-10-CM | POA: Diagnosis not present

## 2018-10-26 DIAGNOSIS — Z79899 Other long term (current) drug therapy: Secondary | ICD-10-CM | POA: Diagnosis not present

## 2018-10-26 DIAGNOSIS — M818 Other osteoporosis without current pathological fracture: Secondary | ICD-10-CM | POA: Diagnosis not present

## 2018-10-26 DIAGNOSIS — E7800 Pure hypercholesterolemia, unspecified: Secondary | ICD-10-CM | POA: Diagnosis not present

## 2018-10-26 DIAGNOSIS — Z6825 Body mass index (BMI) 25.0-25.9, adult: Secondary | ICD-10-CM | POA: Diagnosis not present

## 2018-10-26 DIAGNOSIS — Z0000 Encounter for general adult medical examination without abnormal findings: Secondary | ICD-10-CM | POA: Diagnosis not present

## 2018-12-09 DIAGNOSIS — L820 Inflamed seborrheic keratosis: Secondary | ICD-10-CM | POA: Diagnosis not present

## 2018-12-09 DIAGNOSIS — D044 Carcinoma in situ of skin of scalp and neck: Secondary | ICD-10-CM | POA: Diagnosis not present

## 2018-12-09 DIAGNOSIS — L821 Other seborrheic keratosis: Secondary | ICD-10-CM | POA: Diagnosis not present

## 2018-12-09 DIAGNOSIS — D485 Neoplasm of uncertain behavior of skin: Secondary | ICD-10-CM | POA: Diagnosis not present

## 2018-12-09 DIAGNOSIS — L570 Actinic keratosis: Secondary | ICD-10-CM | POA: Diagnosis not present

## 2018-12-09 DIAGNOSIS — C44629 Squamous cell carcinoma of skin of left upper limb, including shoulder: Secondary | ICD-10-CM | POA: Diagnosis not present

## 2018-12-09 DIAGNOSIS — D692 Other nonthrombocytopenic purpura: Secondary | ICD-10-CM | POA: Diagnosis not present

## 2018-12-09 DIAGNOSIS — D1801 Hemangioma of skin and subcutaneous tissue: Secondary | ICD-10-CM | POA: Diagnosis not present

## 2019-01-04 DIAGNOSIS — N281 Cyst of kidney, acquired: Secondary | ICD-10-CM | POA: Diagnosis not present

## 2019-01-05 DIAGNOSIS — Z85828 Personal history of other malignant neoplasm of skin: Secondary | ICD-10-CM | POA: Diagnosis not present

## 2019-01-05 DIAGNOSIS — L988 Other specified disorders of the skin and subcutaneous tissue: Secondary | ICD-10-CM | POA: Diagnosis not present

## 2019-01-05 DIAGNOSIS — C44629 Squamous cell carcinoma of skin of left upper limb, including shoulder: Secondary | ICD-10-CM | POA: Diagnosis not present

## 2019-01-13 DIAGNOSIS — Z85828 Personal history of other malignant neoplasm of skin: Secondary | ICD-10-CM | POA: Diagnosis not present

## 2019-01-13 DIAGNOSIS — D044 Carcinoma in situ of skin of scalp and neck: Secondary | ICD-10-CM | POA: Diagnosis not present

## 2019-03-18 DIAGNOSIS — S81811A Laceration without foreign body, right lower leg, initial encounter: Secondary | ICD-10-CM | POA: Diagnosis not present

## 2019-04-13 ENCOUNTER — Other Ambulatory Visit: Payer: Self-pay | Admitting: Family Medicine

## 2019-04-13 DIAGNOSIS — Z1231 Encounter for screening mammogram for malignant neoplasm of breast: Secondary | ICD-10-CM

## 2019-04-27 ENCOUNTER — Other Ambulatory Visit: Payer: Self-pay | Admitting: Family Medicine

## 2019-04-27 DIAGNOSIS — M8589 Other specified disorders of bone density and structure, multiple sites: Secondary | ICD-10-CM

## 2019-06-08 ENCOUNTER — Ambulatory Visit: Payer: PPO

## 2019-06-17 ENCOUNTER — Ambulatory Visit
Admission: RE | Admit: 2019-06-17 | Discharge: 2019-06-17 | Disposition: A | Discharge status: Home / Self Care | Payer: PPO | Source: Ambulatory Visit | Attending: Family Medicine | Admitting: Family Medicine

## 2019-06-17 ENCOUNTER — Other Ambulatory Visit: Payer: Self-pay

## 2019-06-17 DIAGNOSIS — M8589 Other specified disorders of bone density and structure, multiple sites: Secondary | ICD-10-CM

## 2019-06-17 DIAGNOSIS — M85851 Other specified disorders of bone density and structure, right thigh: Secondary | ICD-10-CM | POA: Diagnosis not present

## 2019-06-17 DIAGNOSIS — Z780 Asymptomatic menopausal state: Secondary | ICD-10-CM | POA: Diagnosis not present

## 2019-06-17 DIAGNOSIS — M810 Age-related osteoporosis without current pathological fracture: Secondary | ICD-10-CM | POA: Diagnosis not present

## 2019-07-21 ENCOUNTER — Other Ambulatory Visit: Payer: Self-pay

## 2019-07-21 ENCOUNTER — Ambulatory Visit
Admission: RE | Admit: 2019-07-21 | Discharge: 2019-07-21 | Disposition: A | Discharge status: Home / Self Care | Payer: PPO | Source: Ambulatory Visit | Attending: Family Medicine | Admitting: Family Medicine

## 2019-07-21 DIAGNOSIS — Z1231 Encounter for screening mammogram for malignant neoplasm of breast: Secondary | ICD-10-CM

## 2019-07-21 IMAGING — MG MM DIGITAL SCREENING BILAT W/ TOMO W/ CAD
6 of 10 series · 6 of 30 positions shown · non-contrast
Comparison: Previous exam(s).

CLINICAL DATA: Screening.

EXAM:
DIGITAL SCREENING BILATERAL MAMMOGRAM WITH TOMO AND CAD

[R MLO synth-2D]
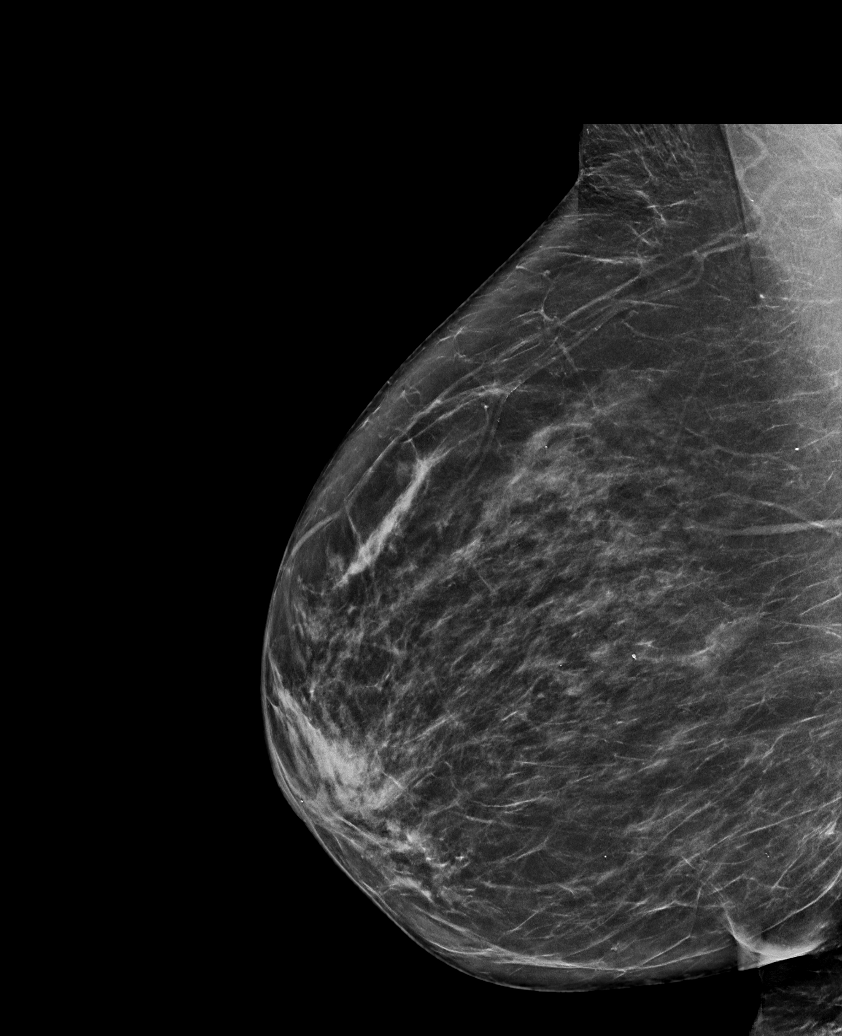

[L MLO synth-2D (1 of 2)]
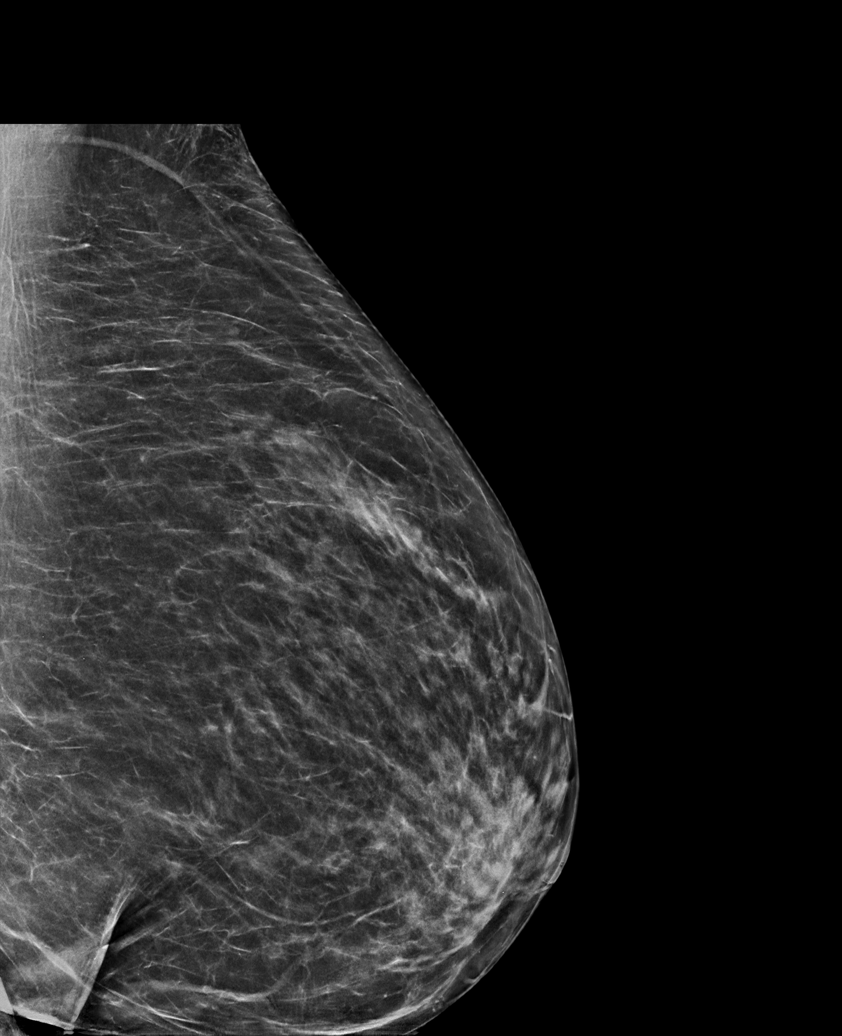

[R CC synth-2D]
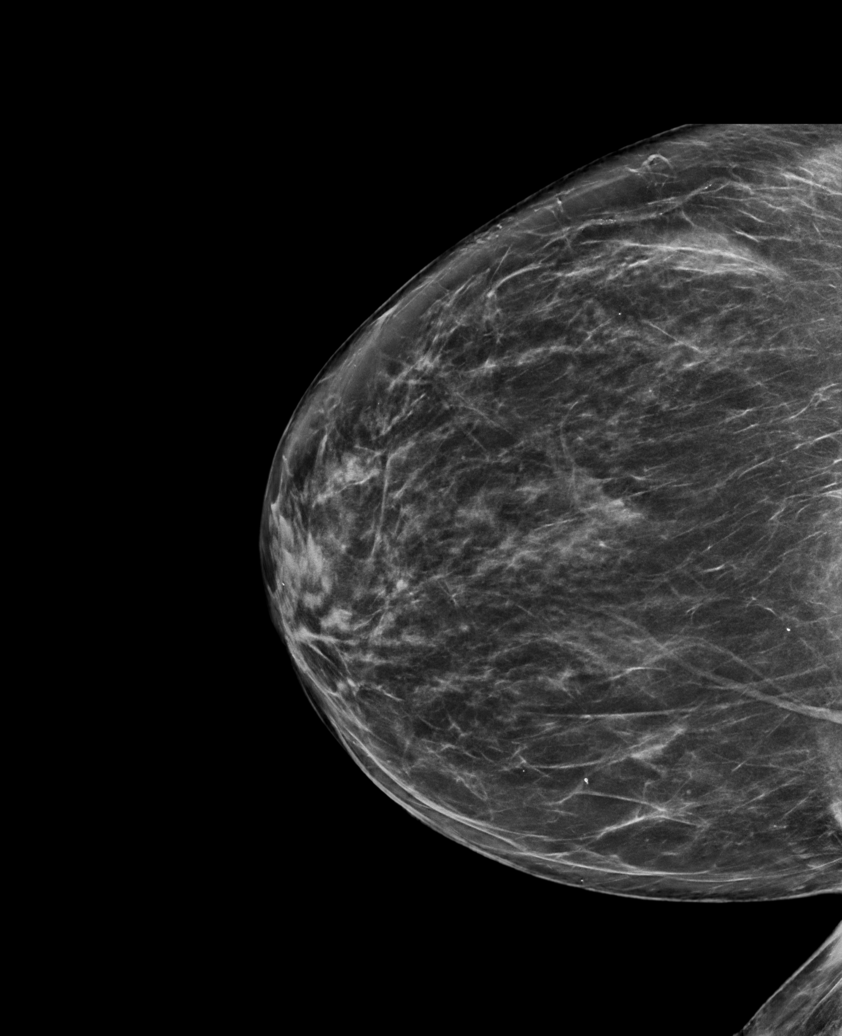

[L CC synth-2D]
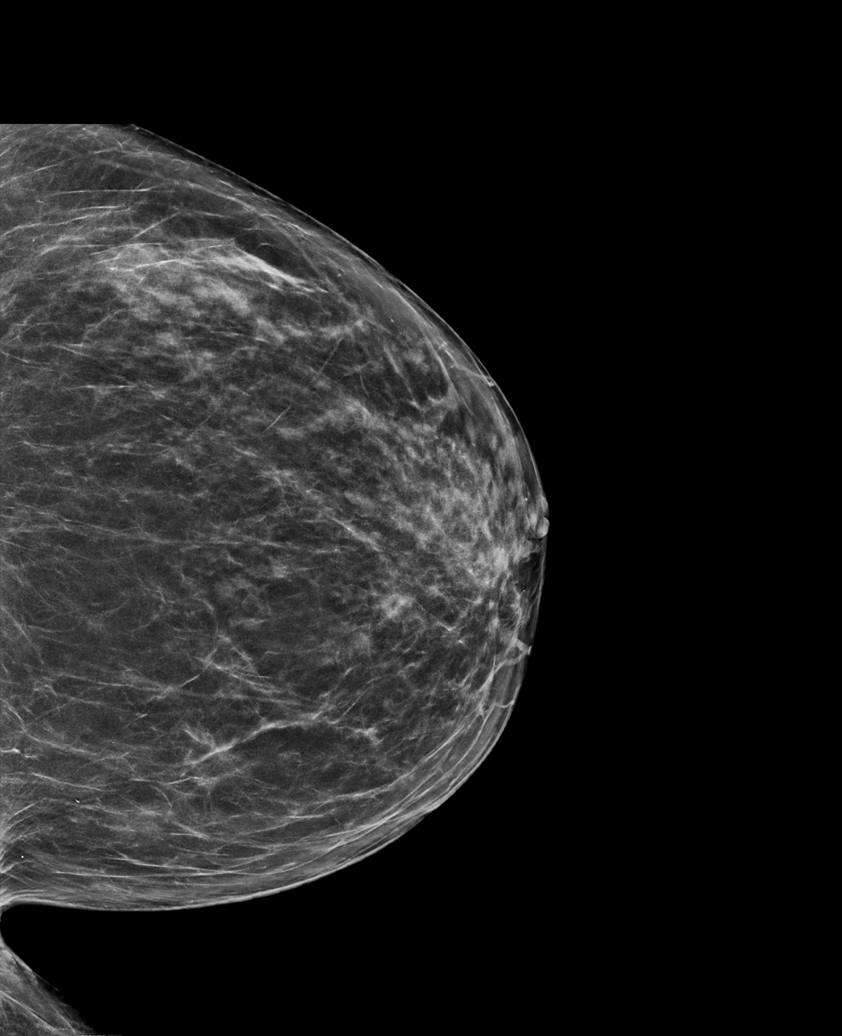

[L MLO synth-2D (2 of 2)]
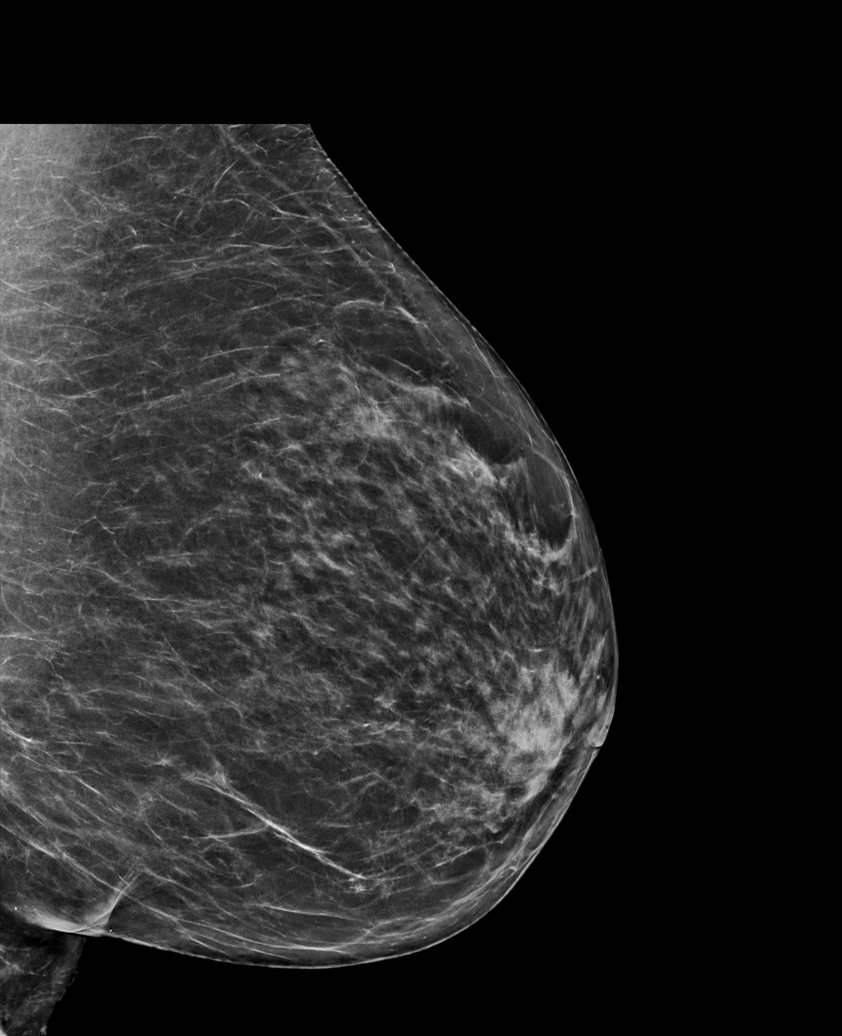

[L CC tomo · tomo slice 36/71.0]
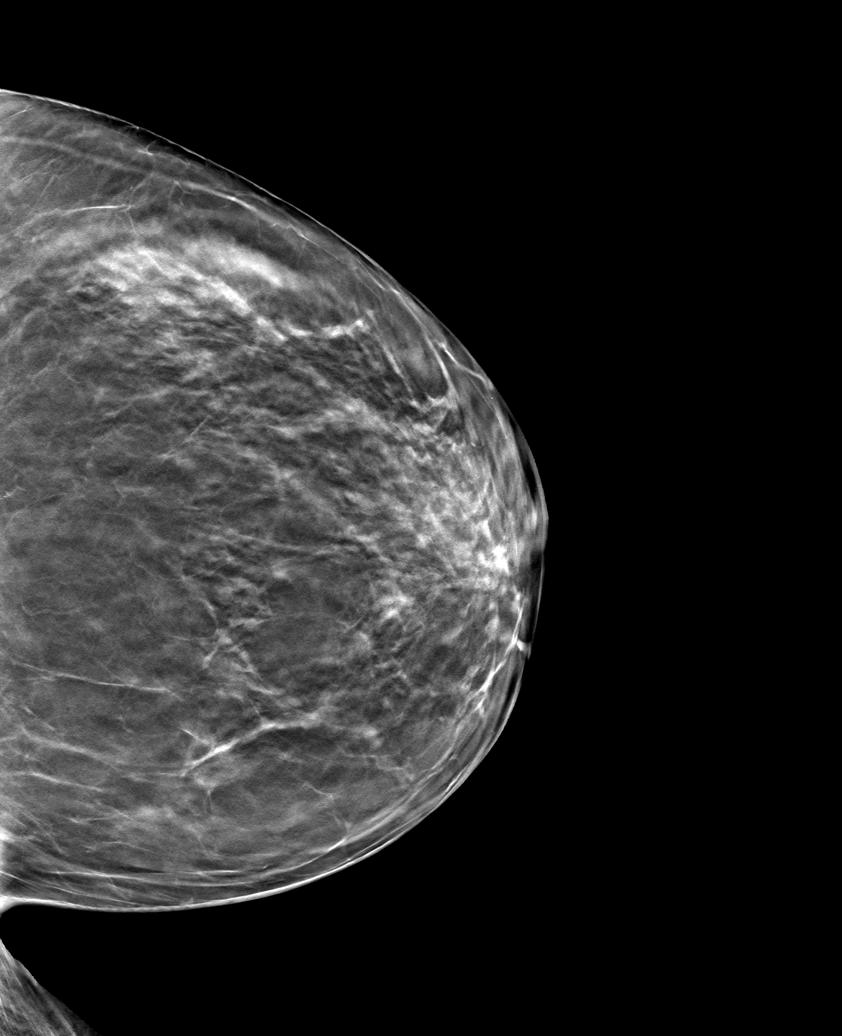

[6 of 30 positions shown; findings below may reference images not displayed]

ACR Breast Density Category b: There are scattered areas of
fibroglandular density.
FINDINGS: There are no findings suspicious for malignancy. Images were
processed with CAD.
IMPRESSION: No mammographic evidence of malignancy. A result letter of this
screening mammogram will be mailed directly to the patient.

RECOMMENDATION:
Screening mammogram in one year. (Code:[TQ])

BI-RADS CATEGORY  1: Negative.

## 2019-10-01 DIAGNOSIS — R410 Disorientation, unspecified: Secondary | ICD-10-CM | POA: Diagnosis not present

## 2019-10-01 DIAGNOSIS — R4182 Altered mental status, unspecified: Secondary | ICD-10-CM | POA: Diagnosis not present

## 2019-10-01 DIAGNOSIS — G454 Transient global amnesia: Secondary | ICD-10-CM | POA: Diagnosis not present

## 2019-10-01 DIAGNOSIS — R0602 Shortness of breath: Secondary | ICD-10-CM | POA: Diagnosis not present

## 2019-10-11 DIAGNOSIS — R413 Other amnesia: Secondary | ICD-10-CM | POA: Diagnosis not present

## 2019-10-11 DIAGNOSIS — E11319 Type 2 diabetes mellitus with unspecified diabetic retinopathy without macular edema: Secondary | ICD-10-CM | POA: Diagnosis not present

## 2019-10-11 DIAGNOSIS — I10 Essential (primary) hypertension: Secondary | ICD-10-CM | POA: Diagnosis not present

## 2019-10-11 DIAGNOSIS — E039 Hypothyroidism, unspecified: Secondary | ICD-10-CM | POA: Diagnosis not present

## 2019-10-26 DIAGNOSIS — M5416 Radiculopathy, lumbar region: Secondary | ICD-10-CM | POA: Diagnosis not present

## 2019-10-26 DIAGNOSIS — M48061 Spinal stenosis, lumbar region without neurogenic claudication: Secondary | ICD-10-CM | POA: Diagnosis not present

## 2019-10-28 DIAGNOSIS — E7800 Pure hypercholesterolemia, unspecified: Secondary | ICD-10-CM | POA: Diagnosis not present

## 2019-10-28 DIAGNOSIS — Z0000 Encounter for general adult medical examination without abnormal findings: Secondary | ICD-10-CM | POA: Diagnosis not present

## 2019-10-28 DIAGNOSIS — M818 Other osteoporosis without current pathological fracture: Secondary | ICD-10-CM | POA: Diagnosis not present

## 2019-10-28 DIAGNOSIS — M545 Low back pain: Secondary | ICD-10-CM | POA: Diagnosis not present

## 2019-10-29 ENCOUNTER — Encounter: Payer: Self-pay | Admitting: Neurology

## 2019-10-29 ENCOUNTER — Other Ambulatory Visit: Payer: Self-pay

## 2019-10-29 ENCOUNTER — Ambulatory Visit: Payer: PPO | Admitting: Neurology

## 2019-10-29 ENCOUNTER — Telehealth: Payer: Self-pay

## 2019-10-29 VITALS — BP 137/89 | HR 91 | Temp 97.3°F | Ht 65.5 in | Wt 150.3 lb

## 2019-10-29 DIAGNOSIS — R404 Transient alteration of awareness: Secondary | ICD-10-CM

## 2019-10-29 DIAGNOSIS — G454 Transient global amnesia: Secondary | ICD-10-CM | POA: Diagnosis not present

## 2019-10-29 DIAGNOSIS — Z8669 Personal history of other diseases of the nervous system and sense organs: Secondary | ICD-10-CM

## 2019-10-29 DIAGNOSIS — H539 Unspecified visual disturbance: Secondary | ICD-10-CM

## 2019-10-29 NOTE — Progress Notes (Signed)
[facility]  PATIENT: Stephanie Mckee DOB: July 15, 1941  REFERRING DOCTOR OR PCP: Greig Right, MD SOURCE: Patient, notes from primary care, notes from emergency room ([place]), imaging and lab reports.  If images become available today will be reviewed.  _________________________________   HISTORICAL  CHIEF COMPLAINT:  Chief Complaint  Patient presents with  . New Patient (Initial Visit)    Rm 13 referred by Chrystie Nose, NP for Amnesia and Hypercalcemia- pt sts 2-3 weeks ago she was cleaning the bathroom and had amnesia event. sts this type of episode has happened one other time. Both events happened while she was cleaning.     HISTORY OF PRESENT ILLNESS:  I had the pleasure seeing your patient, Stephanie Mckee, at [facility] for neurologic consultation regarding her episodes of amnesia.  She is a 78 year old woman who presented to the emergency room 10/01/2019 with an acute confusional episode.  The event lasted about 1 to 1.5 hours and resolved while she was in the emergency room.  At the time the incident started, she was at her vacation home, in a small bathroom cleaning.  There may been some fumes from the window cleaner.  She could not remember her husband's name and kept on repeating the same questions.  She went to the emergency room and was evaluated.  She received a sedative for the MRI and fell asleep.   Her memory was baseline when she woke up.    She has been fine since.  Besides the amnesia, there was no other neurologic signs or symptoms.  I reviewed records from the emergency room ([facility] in [place].  CT scan of the head was felt to be unremarkable for her age.  EKG showed normal sinus rhythm and probable left atrial enlargement.  Chest x-ray showed no acute cardiopulmonary disease.  MRI of the brain showed minimal T2/flair hyperintensity in the frontal lobe white matter and evidence of  previous right cataract surgery and a small mucous retention cyst in the left maxillary sinus.  There were no acute intracranial abnormalities.  CBC coagulation UA, and CMP was unremarkable except for mildly elevated calcium of 11.0.  Tox screen was negative.  She reports having a similar episode in 2000 that lasted a longer period of time.   This also occurred while cleaning the bathroom and went to [facility].   This event lasted about 1/2 day.  She had been using Chlorox in a small room at the time .    She does remember some details from the hospital.  She was close to baseline after she went to sleep and woke up in the morning but still had some loss of memory.    She kept on asking the same questions over and over.  Her evaluation in the hospital was normal.  She has a history of migraines in the past.   When they occurred, she had a visual aura before the headache started.   She has two episodes of a black spot in her eyes that would enlarge.  The episodes occured at night.     These occurred twice, both on vacation this summer and last summer.  She has a strong family history of migraine.     REVIEW OF SYSTEMS: Constitutional: No fevers, chills, sweats, or change in appetite Eyes: No visual changes, double vision, eye pain Ear, nose and throat: No hearing loss, ear pain, nasal congestion, sore throat Cardiovascular: No chest pain, palpitations  Respiratory: No shortness of breath at rest or with exertion.   No wheezes GastrointestinaI: No nausea, vomiting, diarrhea, abdominal pain, fecal incontinence Genitourinary: No dysuria, urinary retention or frequency.  No nocturia. Musculoskeletal: No neck pain, back pain Integumentary: No rash, pruritus, skin lesions Neurological: as above Psychiatric: No depression at this time.  No anxiety Endocrine: No palpitations, diaphoresis, change in appetite, change in weigh or increased thirst Hematologic/Lymphatic: No anemia, purpura,  petechiae. Allergic/Immunologic: No itchy/runny eyes, nasal congestion, recent allergic reactions, rashes  ALLERGIES: Allergies  Allergen Reactions  . Actonel [Risedronate Sodium]     Decreased bone density   . Hydromorphone Nausea And Vomiting  . Prochlorperazine Other (See Comments)    Muscle seizure (drooling, tongue turns sideways; "weird things")    HOME MEDICATIONS:  Current Outpatient Medications:  .  Biotin 1000 MCG tablet, Take by mouth., Disp: , Rfl:  .  celecoxib (CELEBREX) 200 MG capsule, Take 200 mg by mouth daily. , Disp: , Rfl:  .  cyclobenzaprine (FLEXERIL) 10 MG tablet, Take 10 mg by mouth 3 (three) times daily as needed for muscle spasms., Disp: , Rfl:  .  glucosamine-chondroitin 500-400 MG tablet, Take by mouth., Disp: , Rfl:  .  NON FORMULARY, occuvite, Disp: , Rfl:  .  PREDNISONE PO, Take by mouth. Tapering dosage, Disp: , Rfl:  .  traMADol (ULTRAM) 50 MG tablet, Take by mouth every 6 (six) hours as needed., Disp: , Rfl:  .  vitamin B-12 (CYANOCOBALAMIN) 1000 MCG tablet, Take by mouth., Disp: , Rfl:   PAST MEDICAL HISTORY: Past Medical History:  Diagnosis Date  . Heart murmur   . Osteoporosis   . Thoracic degenerative disc disease   . Thyroid nodule     PAST SURGICAL HISTORY: Past Surgical History:  Procedure Laterality Date  . CATARACT EXTRACTION Right   . thumb surgery      FAMILY HISTORY: Family History  Problem Relation Age of Onset  . Breast cancer Maternal Aunt   . Breast cancer Daughter   . Dementia Mother   . Renal cancer Father   . Bone cancer Father   . Multiple myeloma Father   . Healthy Sister     SOCIAL HISTORY:  Social History   Socioeconomic History  . Marital status: Married    Spouse name: Herbie Baltimore   . Number of children: Not on file  . Years of education: Not on file  . Highest education level: Not on file  Occupational History  . Not on file  Tobacco Use  . Smoking status: Never Smoker  . Smokeless tobacco: Never  Used  Substance and Sexual Activity  . Alcohol use: Yes    Comment: 3-4 drinks per week (wine)  . Drug use: Not on file  . Sexual activity: Not on file  Other Topics Concern  . Not on file  Social History Narrative   Caffeine- 1.5 cups per day    Right handed    Lives at home with husband and cat    Social Determinants of Health   Financial Resource Strain:   . Difficulty of Paying Living Expenses: Not on file  Food Insecurity:   . Worried About Charity fundraiser in the Last Year: Not on file  . Ran Out of Food in the Last Year: Not on file  Transportation Needs:   . Lack of Transportation (Medical): Not on file  . Lack of Transportation (Non-Medical): Not on file  Physical Activity:   . Days of Exercise  per Week: Not on file  . Minutes of Exercise per Session: Not on file  Stress:   . Feeling of Stress : Not on file  Social Connections:   . Frequency of Communication with Friends and Family: Not on file  . Frequency of Social Gatherings with Friends and Family: Not on file  . Attends Religious Services: Not on file  . Active Member of Clubs or Organizations: Not on file  . Attends Archivist Meetings: Not on file  . Marital Status: Not on file  Intimate Partner Violence:   . Fear of Current or Ex-Partner: Not on file  . Emotionally Abused: Not on file  . Physically Abused: Not on file  . Sexually Abused: Not on file     PHYSICAL EXAM  Vitals:   10/29/19 0958  BP: 137/89  Pulse: 91  Temp: (!) 97.3 F (36.3 C)  TempSrc: Temporal  Weight: 150 lb 5 oz (68.2 kg)  Height: 5' 5.5" (1.664 m)    Body mass index is 24.63 kg/m.   General: The patient is well-developed and well-nourished and in no acute distress  HEENT:  Head is /AT.  Sclera are anicteric.   Neck: No carotid bruits are noted.  The neck is nontender.  Cardiovascular: The heart has a regular rate and rhythm with a normal S1 and S2. There is a murmur.  No gallops or rubs.    Skin:  Extremities are without rash or  edema.   Neurologic Exam  Mental status: The patient is alert and oriented x 3 at the time of the examination. The patient has apparent normal recent and remote memory, with an apparently normal attention span and concentration ability.   Speech is normal.  Cranial nerves: Extraocular movements are full. There is good facial sensation to soft touch bilaterally.Facial strength is normal.  Trapezius and sternocleidomastoid strength is normal. No dysarthria is noted. No obvious hearing deficits are noted.  Motor:  Muscle bulk is normal.   Tone is normal. Strength is  5 / 5 in all 4 extremities.   Sensory: Sensory testing is intact to pinprick, soft touch and vibration sensation in all 4 extremities.  Coordination: Cerebellar testing reveals good finger-nose-finger and heel-to-shin bilaterally.  Gait and station: Station is normal.   Gait is normal. Tandem gait is normal. Romberg is negative.   Reflexes: Deep tendon reflexes are symmetric and normal bilaterally.   Plantar responses are flexor.      ASSESSMENT AND PLAN  Transient alteration of awareness - Plan: EEG adult, US Carotid Duplex Bilateral  Transient global amnesia - Plan: EEG adult  Visual disturbance - Plan: US Carotid Duplex Bilateral  History of migraine with aura   In summary, Ms. Hucker is a 78 year old woman with a recent episode of amnesia.  Interestingly, she had another episode about 20 years ago and both episodes occurred while she was cleaning her bathroom, possibly exposed to some fumes.  The etiology is uncertain, the spells could represent transient global amnesia.  A migraine variant is also possible.  She also has had 2 episodes of blackening of her vision.  Therefore, we need to check a carotid ultrasound.  I will also check an EEG to see if the episode of amnesia are not related to seizure activity.  I will try to get the actual MRI images (she reported that they had been sent  to [place] but we have not received them).  Some cases of transient global amnesia have been associated  with punctate foci in the CA1 region of the hippocampus adjacent to the temporal horn of the lateral ventricle, seen only on diffusion-weighted images.     Regardless, if the ultrasound and EEG are normal I would not recommend any specific treatment due to the rarity of the events.  If the EEG or ultrasound are abnormal appropriate medication or referral will be made.  I will see her back as needed.  She should call if any new or worsening neurologic symptoms.  Thank you for asking me to see Ms. Lovena Le.  Please let me know if I can be of further assistance with her or other patients as needed.   Linnaea Ahn A. Felecia Shelling, MD, [facility] 18/36/7255, 00:16 AM Certified in Neurology, Clinical Neurophysiology, Sleep Medicine and Neuroimaging  [facility] [address], [place], [place] [address] [phone]

## 2019-10-29 NOTE — Telephone Encounter (Signed)
Stephanie Mckee, could you contact [facility] and request the CT/MRI disk report for this pt. Dr. Jennye Moccasin has a visit with her on 10/29/2019 and requested the disks to be sent.. It appears based of the referral the studies were completed on 10/01/2019.

## 2019-11-01 NOTE — Telephone Encounter (Signed)
Done

## 2019-11-02 DIAGNOSIS — R5383 Other fatigue: Secondary | ICD-10-CM | POA: Diagnosis not present

## 2019-11-02 DIAGNOSIS — R531 Weakness: Secondary | ICD-10-CM | POA: Diagnosis not present

## 2019-11-02 DIAGNOSIS — Z20828 Contact with and (suspected) exposure to other viral communicable diseases: Secondary | ICD-10-CM | POA: Diagnosis not present

## 2019-11-02 DIAGNOSIS — J019 Acute sinusitis, unspecified: Secondary | ICD-10-CM | POA: Diagnosis not present

## 2019-11-09 DIAGNOSIS — Z20828 Contact with and (suspected) exposure to other viral communicable diseases: Secondary | ICD-10-CM | POA: Diagnosis not present

## 2019-11-22 ENCOUNTER — Other Ambulatory Visit: Payer: Self-pay

## 2019-11-22 ENCOUNTER — Ambulatory Visit (INDEPENDENT_AMBULATORY_CARE_PROVIDER_SITE_OTHER): Payer: PPO | Admitting: Neurology

## 2019-11-22 DIAGNOSIS — R404 Transient alteration of awareness: Secondary | ICD-10-CM

## 2019-11-22 DIAGNOSIS — R4182 Altered mental status, unspecified: Secondary | ICD-10-CM

## 2019-11-22 DIAGNOSIS — G454 Transient global amnesia: Secondary | ICD-10-CM

## 2019-11-22 NOTE — Progress Notes (Signed)
   [facility]  EEG (ELECTROENCEPHALOGRAM) REPORT   STUDY DATE: 11/22/2019 PATIENT NAME: Stephanie Mckee DOB: 03/27/1941 MRN: [id number]  ORDERING CLINICIAN: Kayte Borchard A. Felecia Shelling, MD. PhD  TECHNOLOGIST: Nonda Lou, RPSGT TECHNIQUE: Electroencephalogram was recorded utilizing standard 10-20 system of lead placement and reformatted into average and bipolar montages.  RECORDING TIME: 24 minutes 39 seconds  CLINICAL INFORMATION: 79 year old woman with transient alteration of awareness.  FINDINGS: A digital EEG was performed while the patient was awake and drowsy. While awake and most alert there was a 10 hz posterior dominant rhythm. Voltages and frequencies were symmetric.  There were no focal, lateralizing, epileptiform activity or seizures seen.  Photic stimulation had a normal driving response. Hyperventilation and recovery did not change the underlying rhythms. EKG channel shows normal sinus rhythm.  Towards the second half of the recording she was drowsy but did not enter definitive sleep  IMPRESSION: This is a normal EEG while the patient was awake and drowsy.   INTERPRETING PHYSICIAN:   Brighid Koch A. Felecia Shelling, MD, PhD, [facility] Certified in Neurology, Clinical Neurophysiology, Sleep Medicine, Pain Medicine and Neuroimaging  [facility] [address] [place], [place] [address] [phone]

## 2019-11-26 ENCOUNTER — Telehealth: Payer: Self-pay

## 2019-11-26 NOTE — Telephone Encounter (Signed)
Patient called for her EEG results   Please follow up

## 2019-11-29 NOTE — Telephone Encounter (Signed)
Patient called back in regards to missed call please follow up

## 2019-11-29 NOTE — Telephone Encounter (Signed)
Tried calling pt again, went to VM. Please relay message below about EEG being normal per Dr. Felecia Shelling

## 2019-11-29 NOTE — Telephone Encounter (Signed)
Called, LVM for pt. Asked her to call back. No DPR on file.   Ok to let pt know EEG normal per Dr. Felecia Shelling when she calls.

## 2019-11-29 NOTE — Telephone Encounter (Signed)
Please let her know the EEG was normal

## 2019-11-29 NOTE — Telephone Encounter (Signed)
Called pt back. Advised EEG normal. Pt verbalized understanding.

## 2019-11-29 NOTE — Telephone Encounter (Signed)
Pt called back again wanting to know when she will be getting her results. Please advise.

## 2019-12-10 DIAGNOSIS — M65872 Other synovitis and tenosynovitis, left ankle and foot: Secondary | ICD-10-CM | POA: Diagnosis not present

## 2019-12-10 DIAGNOSIS — M79672 Pain in left foot: Secondary | ICD-10-CM | POA: Diagnosis not present

## 2019-12-10 DIAGNOSIS — M79671 Pain in right foot: Secondary | ICD-10-CM | POA: Diagnosis not present

## 2019-12-10 DIAGNOSIS — M65871 Other synovitis and tenosynovitis, right ankle and foot: Secondary | ICD-10-CM | POA: Diagnosis not present

## 2019-12-10 DIAGNOSIS — M25572 Pain in left ankle and joints of left foot: Secondary | ICD-10-CM | POA: Diagnosis not present

## 2019-12-10 DIAGNOSIS — M21612 Bunion of left foot: Secondary | ICD-10-CM | POA: Diagnosis not present

## 2019-12-10 DIAGNOSIS — M21611 Bunion of right foot: Secondary | ICD-10-CM | POA: Diagnosis not present

## 2019-12-14 DIAGNOSIS — D692 Other nonthrombocytopenic purpura: Secondary | ICD-10-CM | POA: Diagnosis not present

## 2019-12-14 DIAGNOSIS — Z85828 Personal history of other malignant neoplasm of skin: Secondary | ICD-10-CM | POA: Diagnosis not present

## 2019-12-14 DIAGNOSIS — L570 Actinic keratosis: Secondary | ICD-10-CM | POA: Diagnosis not present

## 2019-12-14 DIAGNOSIS — L820 Inflamed seborrheic keratosis: Secondary | ICD-10-CM | POA: Diagnosis not present

## 2019-12-14 DIAGNOSIS — D224 Melanocytic nevi of scalp and neck: Secondary | ICD-10-CM | POA: Diagnosis not present

## 2019-12-14 DIAGNOSIS — L821 Other seborrheic keratosis: Secondary | ICD-10-CM | POA: Diagnosis not present

## 2019-12-23 DIAGNOSIS — M25572 Pain in left ankle and joints of left foot: Secondary | ICD-10-CM | POA: Diagnosis not present

## 2019-12-23 DIAGNOSIS — M722 Plantar fascial fibromatosis: Secondary | ICD-10-CM | POA: Diagnosis not present

## 2020-01-19 DIAGNOSIS — H25811 Combined forms of age-related cataract, right eye: Secondary | ICD-10-CM | POA: Diagnosis not present

## 2020-01-31 DIAGNOSIS — S81012A Laceration without foreign body, left knee, initial encounter: Secondary | ICD-10-CM | POA: Diagnosis not present

## 2020-02-02 DIAGNOSIS — S81012D Laceration without foreign body, left knee, subsequent encounter: Secondary | ICD-10-CM | POA: Diagnosis not present

## 2020-03-11 DIAGNOSIS — R21 Rash and other nonspecific skin eruption: Secondary | ICD-10-CM | POA: Diagnosis not present

## 2020-03-21 DIAGNOSIS — M2042 Other hammer toe(s) (acquired), left foot: Secondary | ICD-10-CM | POA: Diagnosis not present

## 2020-03-21 DIAGNOSIS — L97522 Non-pressure chronic ulcer of other part of left foot with fat layer exposed: Secondary | ICD-10-CM | POA: Diagnosis not present

## 2020-04-04 DIAGNOSIS — L97521 Non-pressure chronic ulcer of other part of left foot limited to breakdown of skin: Secondary | ICD-10-CM | POA: Diagnosis not present

## 2020-04-24 DIAGNOSIS — H0014 Chalazion left upper eyelid: Secondary | ICD-10-CM | POA: Diagnosis not present

## 2020-06-14 DIAGNOSIS — N3091 Cystitis, unspecified with hematuria: Secondary | ICD-10-CM | POA: Diagnosis not present

## 2020-06-14 DIAGNOSIS — N3001 Acute cystitis with hematuria: Secondary | ICD-10-CM | POA: Diagnosis not present

## 2020-06-14 DIAGNOSIS — R300 Dysuria: Secondary | ICD-10-CM | POA: Diagnosis not present

## 2020-06-20 DIAGNOSIS — S336XXA Sprain of sacroiliac joint, initial encounter: Secondary | ICD-10-CM | POA: Diagnosis not present

## 2020-08-15 DIAGNOSIS — G629 Polyneuropathy, unspecified: Secondary | ICD-10-CM | POA: Insufficient documentation

## 2020-08-15 DIAGNOSIS — L539 Erythematous condition, unspecified: Secondary | ICD-10-CM | POA: Diagnosis not present

## 2020-08-18 ENCOUNTER — Other Ambulatory Visit: Payer: Self-pay | Admitting: Family Medicine

## 2020-08-18 DIAGNOSIS — Z1231 Encounter for screening mammogram for malignant neoplasm of breast: Secondary | ICD-10-CM

## 2020-08-22 ENCOUNTER — Other Ambulatory Visit: Payer: Self-pay

## 2020-08-22 ENCOUNTER — Ambulatory Visit (HOSPITAL_COMMUNITY)
Admission: RE | Admit: 2020-08-22 | Discharge: 2020-08-22 | Disposition: A | Discharge status: Home / Self Care | Payer: PPO | Source: Ambulatory Visit | Attending: Neurosurgery | Admitting: Neurosurgery

## 2020-08-22 ENCOUNTER — Other Ambulatory Visit (HOSPITAL_COMMUNITY): Payer: Self-pay | Admitting: Neurosurgery

## 2020-08-22 DIAGNOSIS — L539 Erythematous condition, unspecified: Secondary | ICD-10-CM | POA: Insufficient documentation

## 2020-09-14 DIAGNOSIS — M48061 Spinal stenosis, lumbar region without neurogenic claudication: Secondary | ICD-10-CM | POA: Diagnosis not present

## 2020-09-14 DIAGNOSIS — R202 Paresthesia of skin: Secondary | ICD-10-CM | POA: Diagnosis not present

## 2020-09-25 ENCOUNTER — Other Ambulatory Visit: Payer: Self-pay

## 2020-09-25 ENCOUNTER — Ambulatory Visit
Admission: RE | Admit: 2020-09-25 | Discharge: 2020-09-25 | Disposition: A | Discharge status: Home / Self Care | Payer: PPO | Source: Ambulatory Visit | Attending: Family Medicine | Admitting: Family Medicine

## 2020-09-25 DIAGNOSIS — Z1231 Encounter for screening mammogram for malignant neoplasm of breast: Secondary | ICD-10-CM

## 2020-09-25 IMAGING — MG DIGITAL SCREENING BILAT W/ TOMO W/ CAD
8 series · 8 of 24 positions shown · non-contrast
Comparison: Previous exam(s).

CLINICAL DATA: Screening.

EXAM:
DIGITAL SCREENING BILATERAL MAMMOGRAM WITH TOMO AND CAD

[R CC synth-2D]
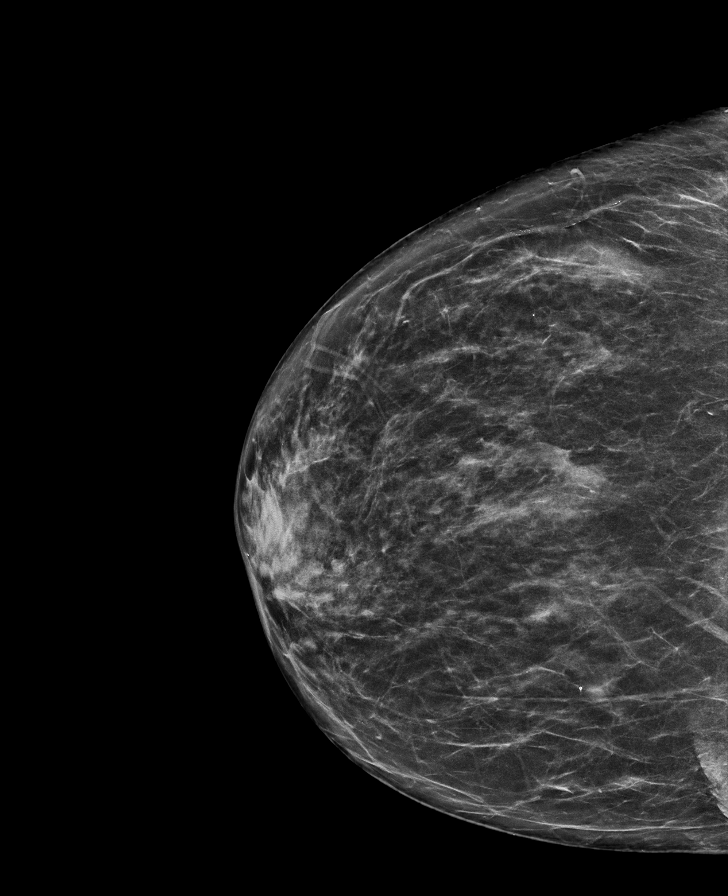

[L CC synth-2D]
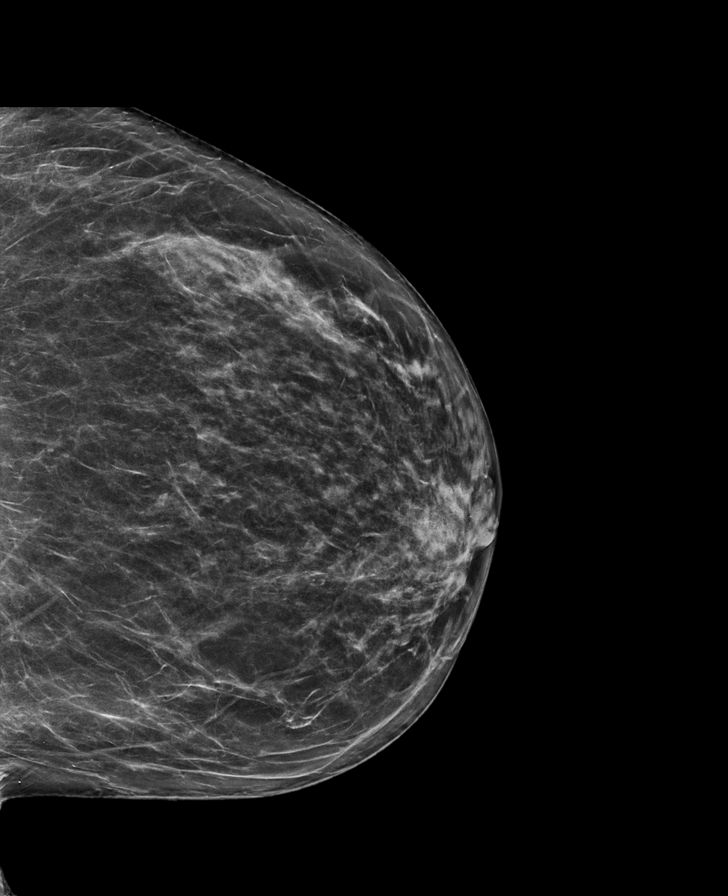

[R MLO synth-2D]
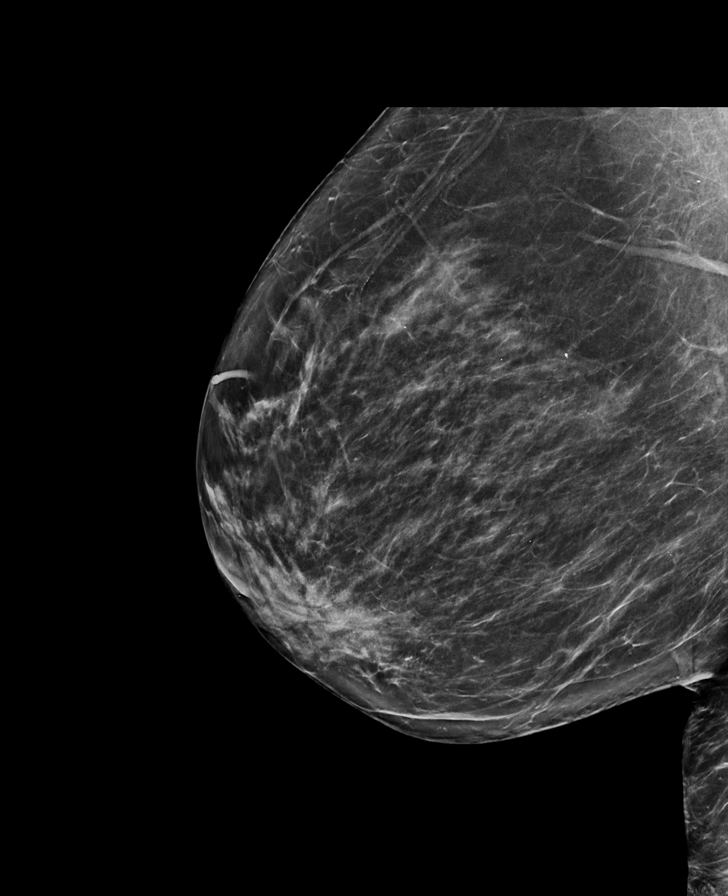

[L MLO synth-2D]
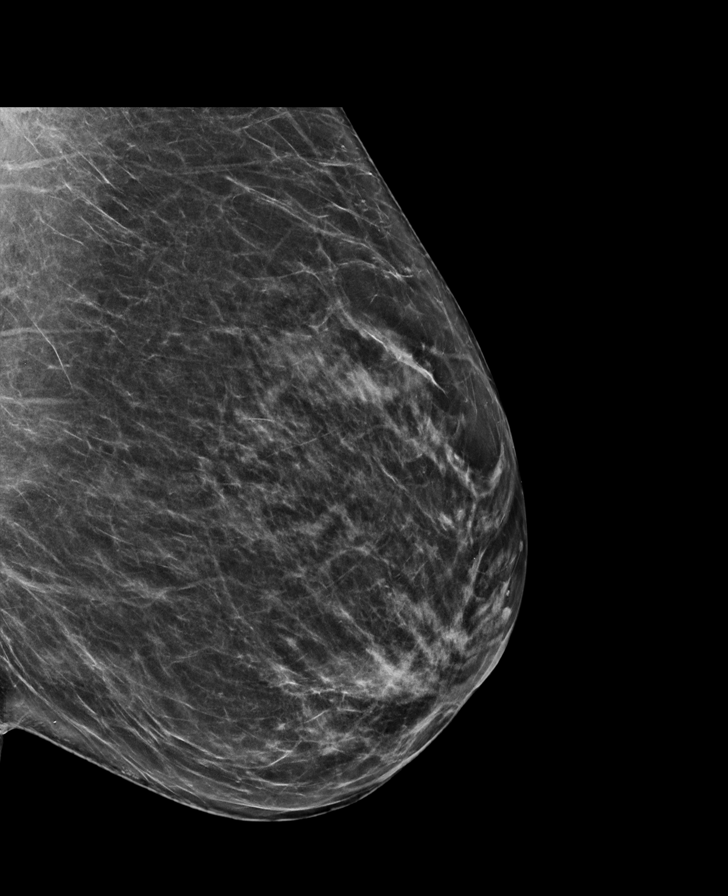

[R MLO tomo · tomo slice 41/80.0]
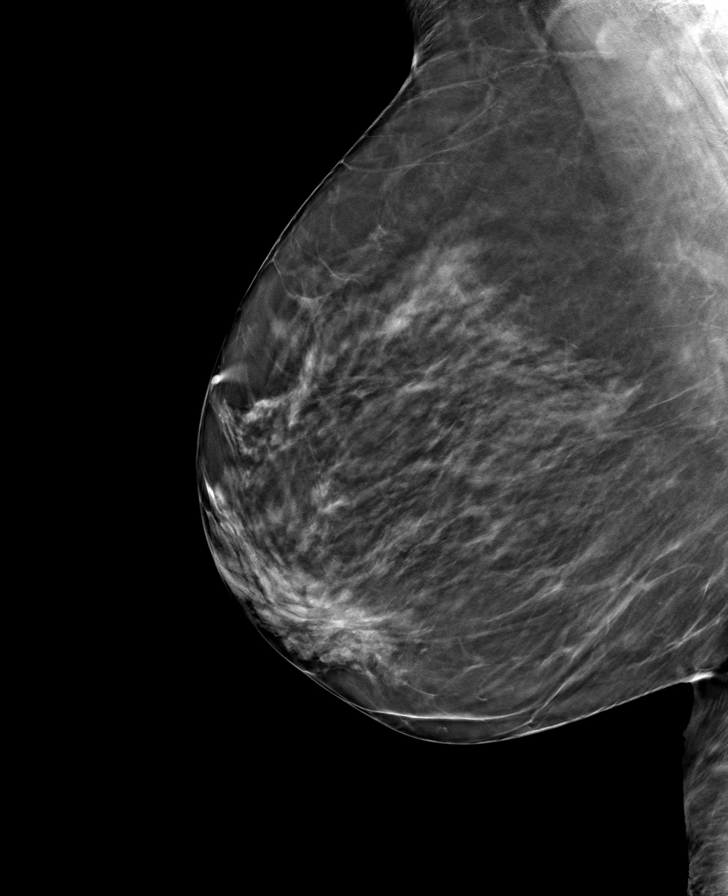

[L MLO tomo · tomo slice 37/72.0]
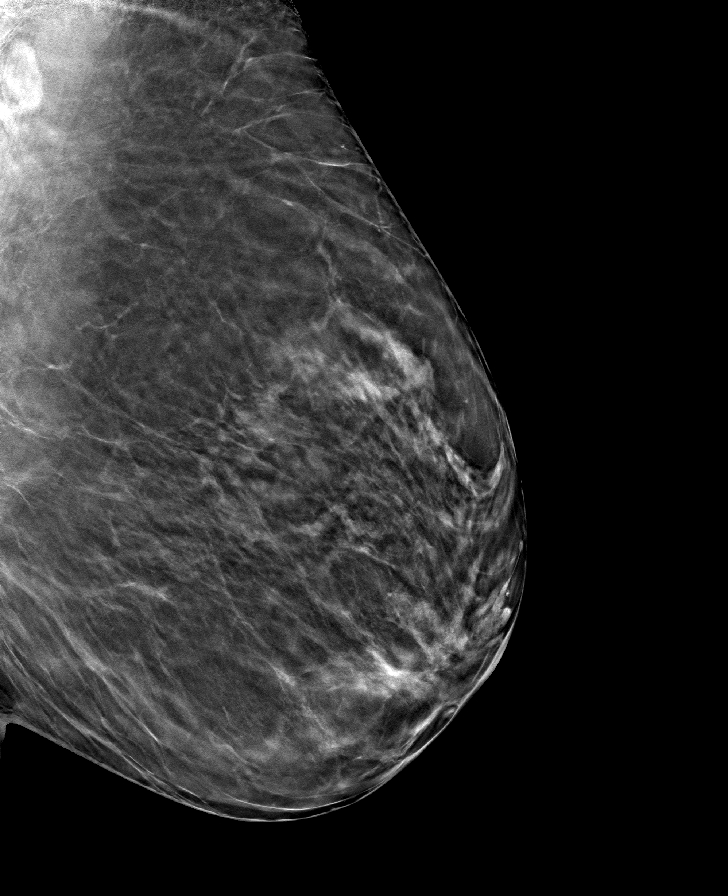

[R CC tomo · tomo slice 37/74.0]
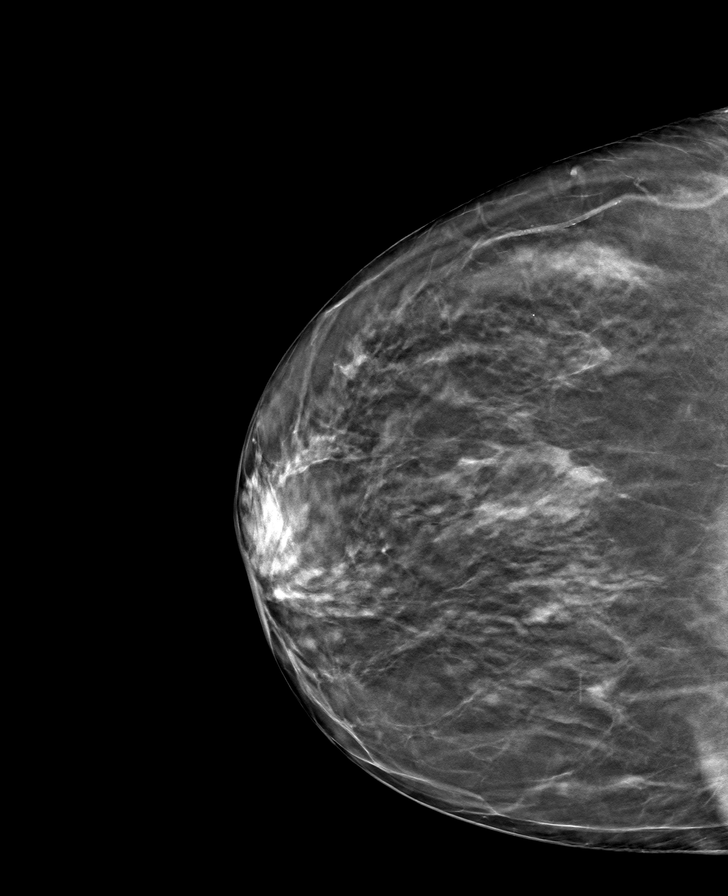

[L CC tomo · tomo slice 35/70.0]
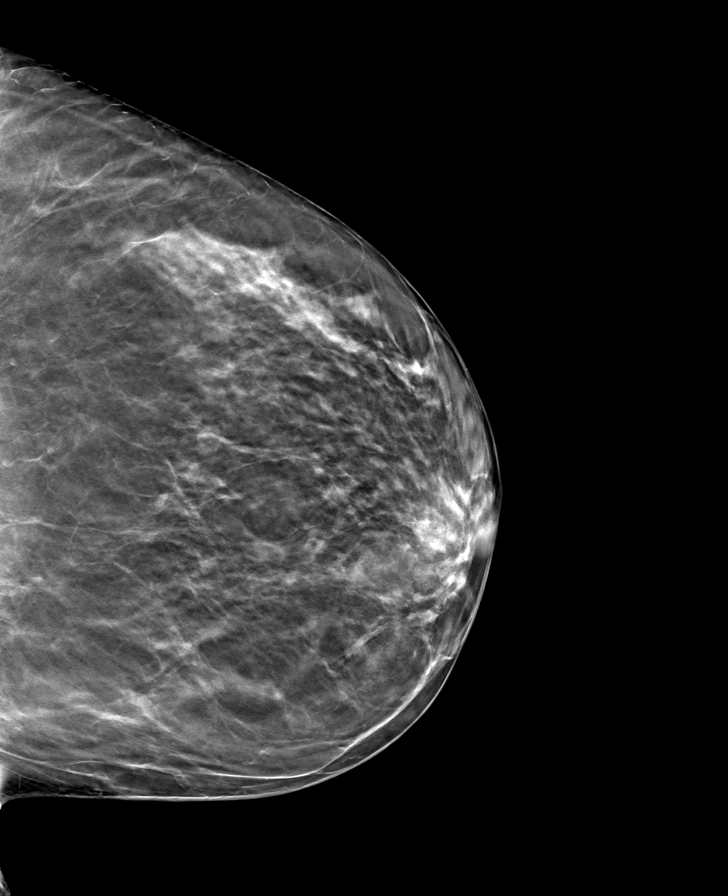

[8 of 24 positions shown; findings below may reference images not displayed]

ACR Breast Density Category c: The breast tissue is heterogeneously
dense, which may obscure small masses.
FINDINGS: There are no findings suspicious for malignancy. Images were
processed with CAD.
IMPRESSION: No mammographic evidence of malignancy. A result letter of this
screening mammogram will be mailed directly to the patient.

RECOMMENDATION:
Screening mammogram in one year. (Code:[5V])

BI-RADS CATEGORY  1: Negative.

## 2020-09-26 DIAGNOSIS — R030 Elevated blood-pressure reading, without diagnosis of hypertension: Secondary | ICD-10-CM | POA: Diagnosis not present

## 2020-09-26 DIAGNOSIS — R202 Paresthesia of skin: Secondary | ICD-10-CM | POA: Diagnosis not present

## 2020-10-09 DIAGNOSIS — Z20828 Contact with and (suspected) exposure to other viral communicable diseases: Secondary | ICD-10-CM | POA: Diagnosis not present

## 2020-10-09 DIAGNOSIS — J069 Acute upper respiratory infection, unspecified: Secondary | ICD-10-CM | POA: Diagnosis not present

## 2020-10-17 DIAGNOSIS — M722 Plantar fascial fibromatosis: Secondary | ICD-10-CM | POA: Diagnosis not present

## 2020-10-17 DIAGNOSIS — M21611 Bunion of right foot: Secondary | ICD-10-CM | POA: Diagnosis not present

## 2020-10-17 DIAGNOSIS — M21612 Bunion of left foot: Secondary | ICD-10-CM | POA: Diagnosis not present

## 2020-10-30 DIAGNOSIS — E7800 Pure hypercholesterolemia, unspecified: Secondary | ICD-10-CM | POA: Diagnosis not present

## 2020-10-30 DIAGNOSIS — Z0000 Encounter for general adult medical examination without abnormal findings: Secondary | ICD-10-CM | POA: Diagnosis not present

## 2020-10-30 DIAGNOSIS — Z6826 Body mass index (BMI) 26.0-26.9, adult: Secondary | ICD-10-CM | POA: Diagnosis not present

## 2020-10-30 DIAGNOSIS — M818 Other osteoporosis without current pathological fracture: Secondary | ICD-10-CM | POA: Diagnosis not present

## 2020-11-06 DIAGNOSIS — M792 Neuralgia and neuritis, unspecified: Secondary | ICD-10-CM | POA: Diagnosis not present

## 2020-11-28 DIAGNOSIS — M5440 Lumbago with sciatica, unspecified side: Secondary | ICD-10-CM | POA: Diagnosis not present

## 2020-11-28 DIAGNOSIS — R030 Elevated blood-pressure reading, without diagnosis of hypertension: Secondary | ICD-10-CM | POA: Diagnosis not present

## 2020-11-28 DIAGNOSIS — Z6826 Body mass index (BMI) 26.0-26.9, adult: Secondary | ICD-10-CM | POA: Diagnosis not present

## 2020-12-13 DIAGNOSIS — Z85828 Personal history of other malignant neoplasm of skin: Secondary | ICD-10-CM | POA: Diagnosis not present

## 2020-12-13 DIAGNOSIS — L570 Actinic keratosis: Secondary | ICD-10-CM | POA: Diagnosis not present

## 2020-12-13 DIAGNOSIS — L720 Epidermal cyst: Secondary | ICD-10-CM | POA: Diagnosis not present

## 2020-12-13 DIAGNOSIS — L821 Other seborrheic keratosis: Secondary | ICD-10-CM | POA: Diagnosis not present

## 2020-12-21 ENCOUNTER — Other Ambulatory Visit: Payer: Self-pay | Admitting: Neurosurgery

## 2020-12-21 DIAGNOSIS — M4316 Spondylolisthesis, lumbar region: Secondary | ICD-10-CM | POA: Diagnosis not present

## 2021-01-01 DIAGNOSIS — M792 Neuralgia and neuritis, unspecified: Secondary | ICD-10-CM | POA: Diagnosis not present

## 2021-01-12 ENCOUNTER — Other Ambulatory Visit: Payer: Self-pay

## 2021-01-12 ENCOUNTER — Ambulatory Visit
Admission: RE | Admit: 2021-01-12 | Discharge: 2021-01-12 | Disposition: A | Discharge status: Home / Self Care | Payer: PPO | Source: Ambulatory Visit | Attending: Neurosurgery | Admitting: Neurosurgery

## 2021-01-12 DIAGNOSIS — M4316 Spondylolisthesis, lumbar region: Secondary | ICD-10-CM

## 2021-01-12 DIAGNOSIS — M545 Low back pain, unspecified: Secondary | ICD-10-CM | POA: Diagnosis not present

## 2021-01-12 DIAGNOSIS — M48061 Spinal stenosis, lumbar region without neurogenic claudication: Secondary | ICD-10-CM | POA: Diagnosis not present

## 2021-01-12 IMAGING — MR MR LUMBAR SPINE W/O CM
4 of 5 series · 18 of 48 positions shown · non-contrast
Comparison: Lumbar radiographs [DATE].  CT lumbar [DATE]

CLINICAL DATA: Low back pain.  Bilateral SI joint pain.

EXAM:
MRI LUMBAR SPINE WITHOUT CONTRAST
TECHNIQUE: Multiplanar, multisequence MR imaging of the lumbar spine was
performed. No intravenous contrast was administered.

[Series 6: T2 · sagittal · 4.0mm · 0.73mm/px · 6 of 15 slices shown (1 of 2)]
[im 1/15]
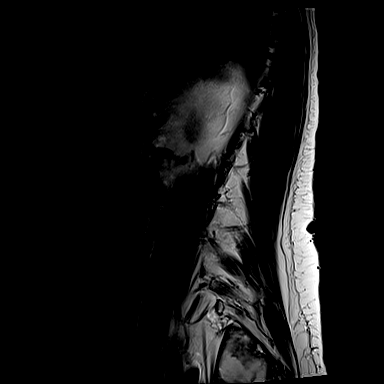
[im 3/15]
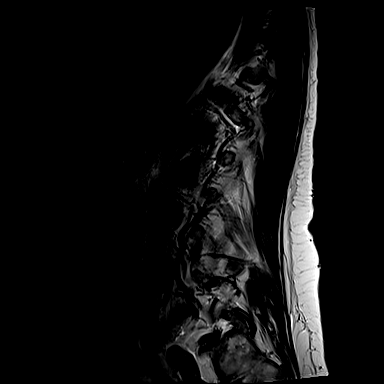
[im 6/15]
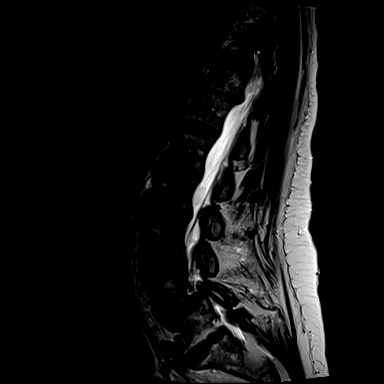
[im 9/15]
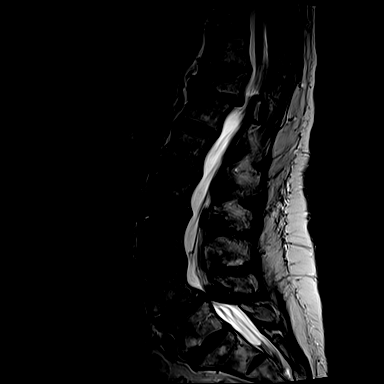
[im 12/15]
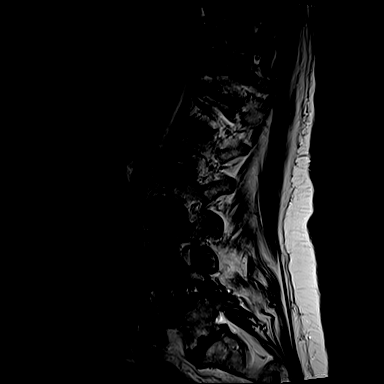
[im 15/15]
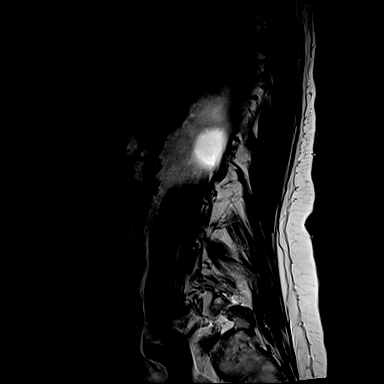

[Series 7: T1 · sagittal · 4.0mm · 0.73mm/px · 3 of 15 slices shown (1 of 2)]
[im 3/15]
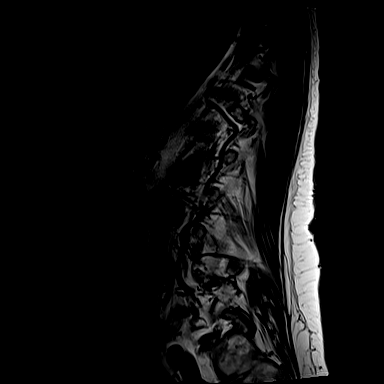
[im 9/15]
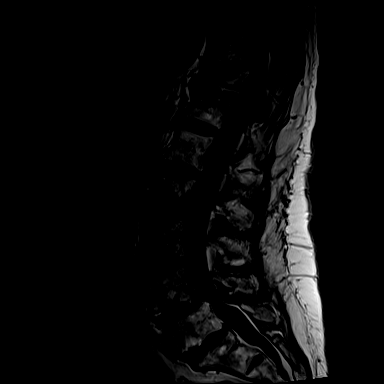
[im 15/15]
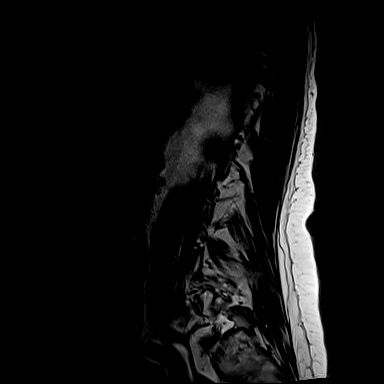

[Series 11: T1 · axial · 4.0mm · 0.28mm/px · z∈[-71,+89]mm · 3 of 39 slices shown (2 of 2)]
[im 6/39]
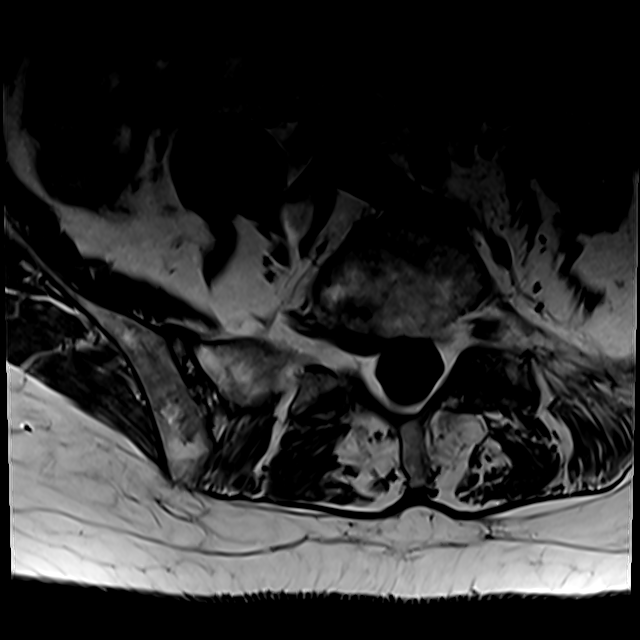
[im 20/39]
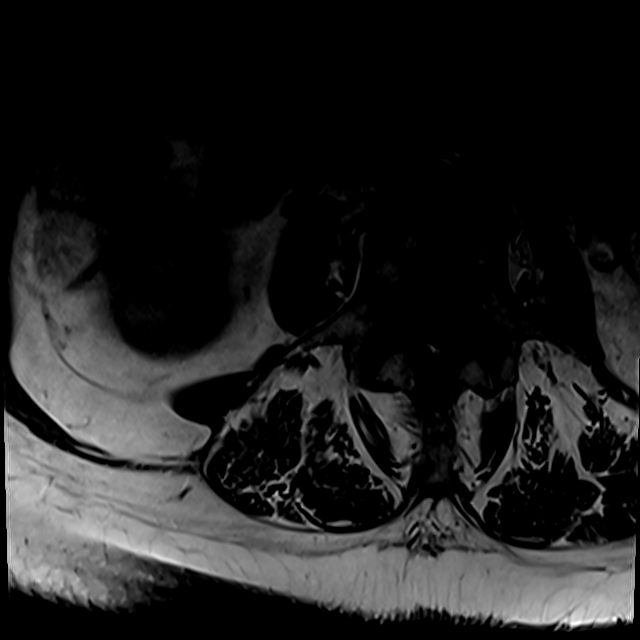
[im 33/39]
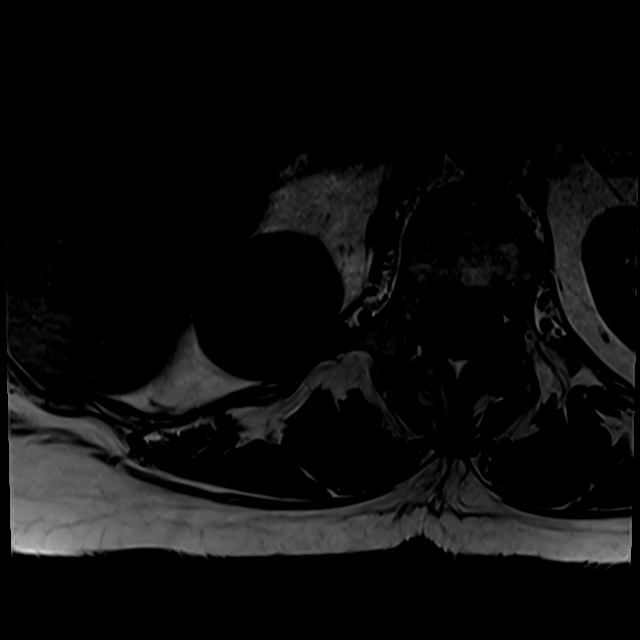

[Series 14: T2 · axial · 4.0mm · 0.28mm/px · z∈[-95,+89]mm · 6 of 39 slices shown (2 of 2)]
[im 1/39]
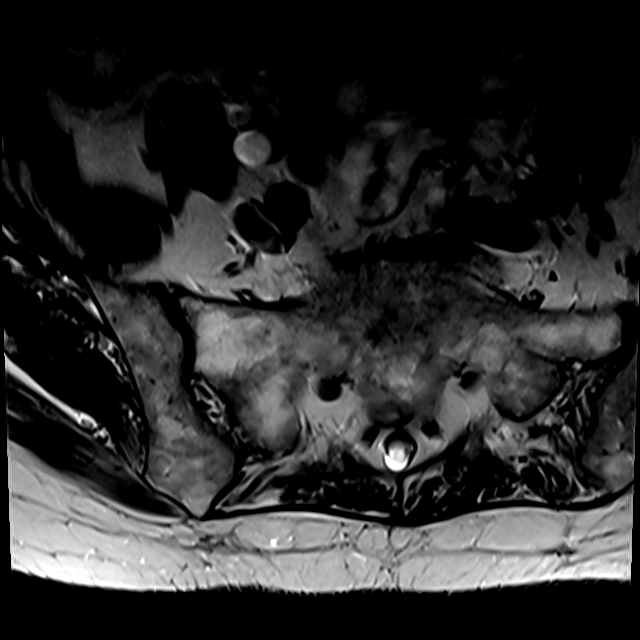
[im 6/39]
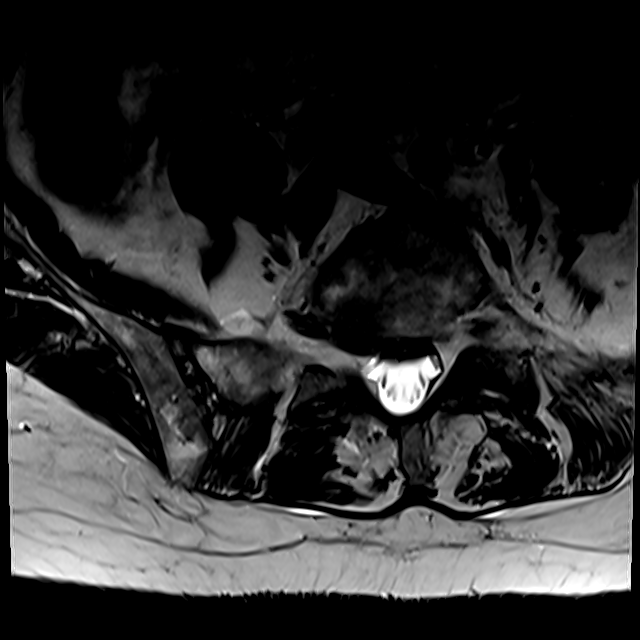
[im 11/39]
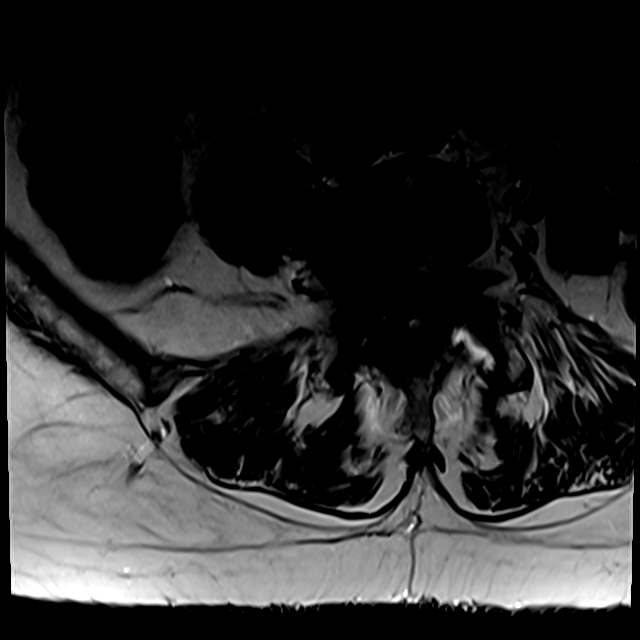
[im 17/39]
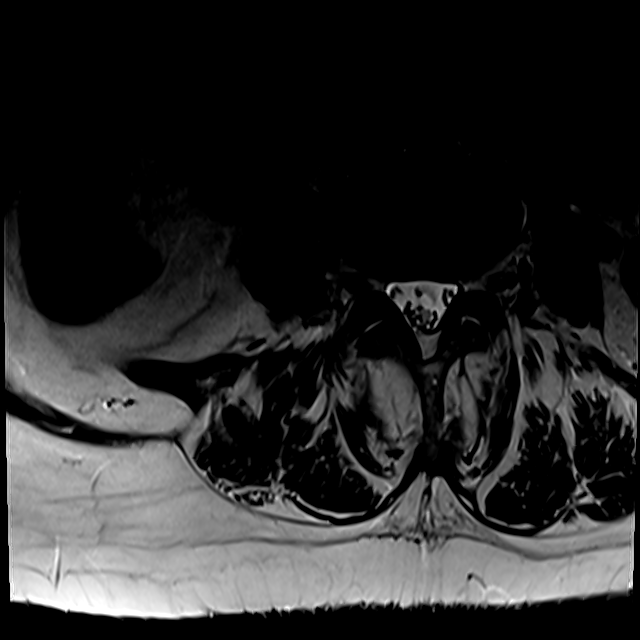
[im 20/39]
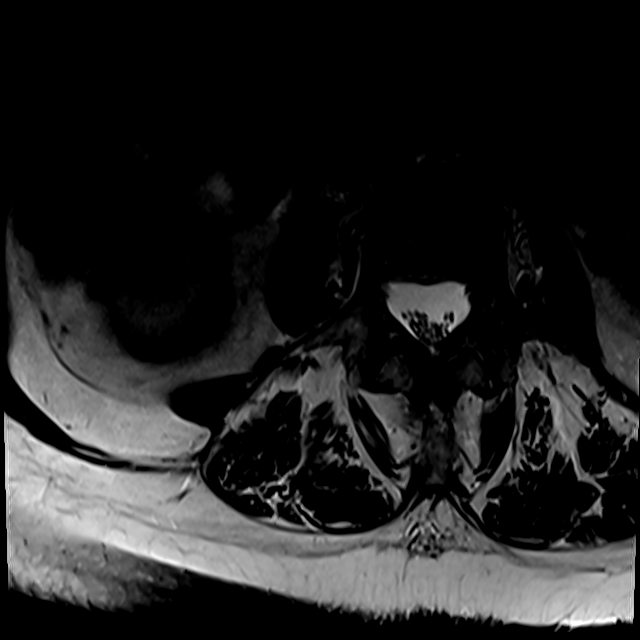
[im 33/39]
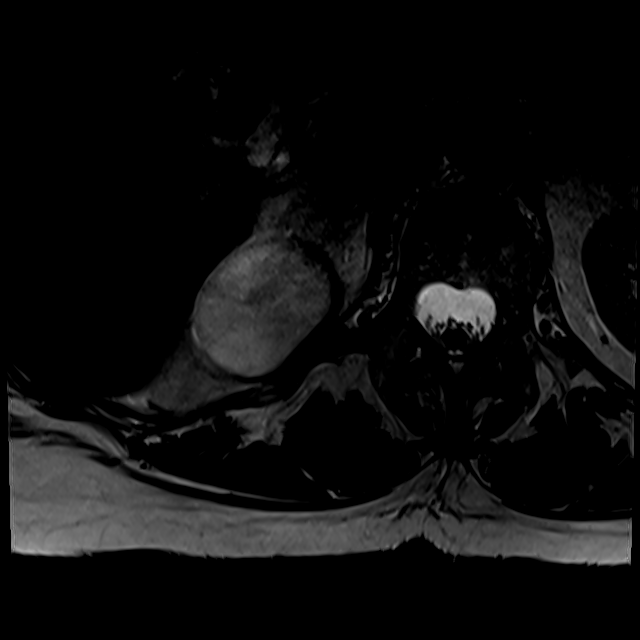

[18 of 48 positions shown; findings below may reference images not displayed]

FINDINGS: Segmentation:  Normal

Alignment:  5 mm retrolisthesis T11-12.  7 mm anterolisthesis L4-5

Vertebrae: Moderately severe chronic compression fracture T11
without bone marrow edema.

Mild inferior endplate of L2 associated with bone marrow edema. This
appears to be a recent healing fracture. This was not present in
[HB] but is present on the recent x-ray.

Limited imaging of the SI joints negative

Conus medullaris and cauda equina: Conus extends to the L1-2 level.
Conus and cauda equina appear normal.

Paraspinal and other soft tissues: Cholelithiasis. No paraspinous
mass or adenopathy.

Disc levels:

T11-12: Disc degeneration with spurring. Retrolisthesis T11-12.
Moderate foraminal stenosis bilaterally

T12-L1: Negative

L1-2: Negative

L2-3: Disc degeneration with mild disc bulging and mild facet
degeneration. Negative for stenosis

L3-4: Negative

L4-5: 7 mm anterolisthesis with diffuse disc bulging and severe
facet hypertrophy. Severe spinal stenosis. Severe subarticular
stenosis bilaterally

L5-S1: Disc degeneration with mild spurring and mild facet
degeneration. Negative for stenosis.
IMPRESSION: 1. Chronic compression fracture T11. Mild inferior endplate fracture
of L2 with bone marrow edema suggesting a recent healing fracture.
2. Grade 1 anterolisthesis L4-5 with severe spinal stenosis at L4-5.

## 2021-01-25 DIAGNOSIS — R030 Elevated blood-pressure reading, without diagnosis of hypertension: Secondary | ICD-10-CM | POA: Diagnosis not present

## 2021-01-25 DIAGNOSIS — Z6825 Body mass index (BMI) 25.0-25.9, adult: Secondary | ICD-10-CM | POA: Diagnosis not present

## 2021-01-25 DIAGNOSIS — M4316 Spondylolisthesis, lumbar region: Secondary | ICD-10-CM | POA: Diagnosis not present

## 2021-01-29 DIAGNOSIS — Z01810 Encounter for preprocedural cardiovascular examination: Secondary | ICD-10-CM | POA: Diagnosis not present

## 2021-01-29 DIAGNOSIS — Z79899 Other long term (current) drug therapy: Secondary | ICD-10-CM | POA: Diagnosis not present

## 2021-01-29 DIAGNOSIS — M549 Dorsalgia, unspecified: Secondary | ICD-10-CM | POA: Diagnosis not present

## 2021-02-01 ENCOUNTER — Other Ambulatory Visit: Payer: Self-pay | Admitting: Neurosurgery

## 2021-02-15 DIAGNOSIS — M4317 Spondylolisthesis, lumbosacral region: Secondary | ICD-10-CM | POA: Diagnosis not present

## 2021-02-21 ENCOUNTER — Encounter (HOSPITAL_COMMUNITY)
Admission: RE | Admit: 2021-02-21 | Discharge: 2021-02-21 | Disposition: A | Discharge status: Home / Self Care | Payer: PPO | Source: Ambulatory Visit | Attending: Neurosurgery | Admitting: Neurosurgery

## 2021-02-21 ENCOUNTER — Other Ambulatory Visit: Payer: Self-pay

## 2021-02-21 ENCOUNTER — Encounter (HOSPITAL_COMMUNITY): Payer: Self-pay

## 2021-02-21 DIAGNOSIS — Z01812 Encounter for preprocedural laboratory examination: Secondary | ICD-10-CM | POA: Diagnosis not present

## 2021-02-21 LAB — CBC
HCT: 39.2 % (ref 36.0–46.0)
Hemoglobin: 13.5 g/dL (ref 12.0–15.0)
MCH: 30.9 pg (ref 26.0–34.0)
MCHC: 34.4 g/dL (ref 30.0–36.0)
MCV: 89.7 fL (ref 80.0–100.0)
Platelets: 155 10*3/uL (ref 150–400)
RBC: 4.37 MIL/uL (ref 3.87–5.11)
RDW: 12.7 % (ref 11.5–15.5)
WBC: 4 10*3/uL (ref 4.0–10.5)
nRBC: 0 % (ref 0.0–0.2)

## 2021-02-21 LAB — SURGICAL PCR SCREEN
MRSA, PCR: NEGATIVE
Staphylococcus aureus: NEGATIVE

## 2021-02-21 LAB — TYPE AND SCREEN
ABO/RH(D): A POS
Antibody Screen: NEGATIVE

## 2021-02-21 NOTE — Progress Notes (Addendum)
Surgical Instructions    Your procedure is scheduled on 02/23/21.  Report to [facility] Main Entrance "A" at 10:55 A.M., then check in with the Admitting office.  Call this number if you have problems the morning of surgery:  [phone]   If you have any questions prior to your surgery date call [phone]: Open Monday-Friday 8am-4pm    Remember:  Do not eat or drink after midnight the night before your surgery    Take these medicines the morning of surgery with A SIP OF WATER: cetirizine (ZYRTEC)   As of today, STOP taking any Aspirin (unless otherwise instructed by your surgeon) Aleve, Naproxen, Ibuprofen, Motrin, Advil, Goody's, BC's, all herbal medications, fish oil, and all vitamins.                     Do not wear jewelry, make up, or nail polish            Do not wear lotions, powders, perfumes or deodorant.            Do not shave 48 hours prior to surgery.              Do not bring valuables to the hospital.            [facility] is not responsible for any belongings or valuables.  Do NOT Smoke (Tobacco/Vaping) or drink Alcohol 24 hours prior to your procedure If you use a CPAP at night, you may bring all equipment for your overnight stay.   Contacts, glasses, dentures or bridgework may not be worn into surgery, please bring cases for these belongings   For patients admitted to the hospital, discharge time will be determined by your treatment team.   Patients discharged the day of surgery will not be allowed to drive home, and someone needs to stay with them for 24 hours.    Special instructions:   - Preparing For Surgery  Before surgery, you can play an important role. Because skin is not sterile, your skin needs to be as free of germs as possible. You can reduce the number of germs on your skin by washing with CHG (chlorahexidine gluconate) Soap before surgery.  CHG is an antiseptic cleaner which kills germs and bonds with the skin to continue killing  germs even after washing.    Oral Hygiene is also important to reduce your risk of infection.  Remember - BRUSH YOUR TEETH THE MORNING OF SURGERY WITH YOUR REGULAR TOOTHPASTE  Please do not use if you have an allergy to CHG or antibacterial soaps. If your skin becomes reddened/irritated stop using the CHG.  Do not shave (including legs and underarms) for at least 48 hours prior to first CHG shower. It is OK to shave your face.  Please follow these instructions carefully.   1. Shower the NIGHT BEFORE SURGERY and the MORNING OF SURGERY  2. If you chose to wash your hair, wash your hair first as usual with your normal shampoo.  3. After you shampoo, rinse your hair and body thoroughly to remove the shampoo.  4. Wash Face and genitals (private parts) with your normal soap.   5.  Shower the NIGHT BEFORE SURGERY and the MORNING OF SURGERY with CHG Soap.   6. Use CHG Soap as you would any other liquid soap. You can apply CHG directly to the skin and wash gently with a scrungie or a clean washcloth.   7. Apply the CHG Soap to your  body ONLY FROM THE NECK DOWN.  Do not use on open wounds or open sores. Avoid contact with your eyes, ears, mouth and genitals (private parts). Wash Face and genitals (private parts)  with your normal soap.   8. Wash thoroughly, paying special attention to the area where your surgery will be performed.  9. Thoroughly rinse your body with warm water from the neck down.  10. DO NOT shower/wash with your normal soap after using and rinsing off the CHG Soap.  11. Pat yourself dry with a CLEAN TOWEL.  12. Wear CLEAN PAJAMAS to bed the night before surgery  13. Place CLEAN SHEETS on your bed the night before your surgery  14. DO NOT SLEEP WITH PETS.   Day of Surgery: Wear Clean/Comfortable clothing the morning of surgery Do not apply any deodorants/lotions.   Remember to brush your teeth WITH YOUR REGULAR TOOTHPASTE.   Please read over the following fact sheets  that you were given.

## 2021-02-21 NOTE — Progress Notes (Signed)
PCP: Greig Right, MD Cardiologist: Denies  EKG: na CXR: na ECHO: denies Stress Test: denies Cardiac Cath: denies  Fasting Blood Sugar- na Checks Blood Sugar__na_ times a day  OSA/CPAP: No  ASA/Blood Thinner: No  Covid test 02/21/21 at PAT  Anesthesia Review: No  Patient denies shortness of breath, fever, cough, and chest pain at PAT appointment.  Patient verbalized understanding of instructions provided today at the PAT appointment.  Patient asked to review instructions at home and day of surgery.

## 2021-02-23 ENCOUNTER — Ambulatory Visit (HOSPITAL_COMMUNITY)
Admission: RE | Admit: 2021-02-23 | Discharge: 2021-02-24 | Disposition: A | Discharge status: Home / Self Care | Payer: PPO | Attending: Neurosurgery | Admitting: Neurosurgery

## 2021-02-23 ENCOUNTER — Other Ambulatory Visit: Payer: Self-pay

## 2021-02-23 ENCOUNTER — Ambulatory Visit (HOSPITAL_COMMUNITY): Payer: PPO | Admitting: Anesthesiology

## 2021-02-23 ENCOUNTER — Encounter (HOSPITAL_COMMUNITY): Payer: Self-pay | Admitting: Neurosurgery

## 2021-02-23 ENCOUNTER — Encounter (HOSPITAL_COMMUNITY)
Admission: RE | Disposition: A | Discharge status: Home / Self Care | Payer: Self-pay | Source: Home / Self Care | Attending: Neurosurgery

## 2021-02-23 ENCOUNTER — Ambulatory Visit (HOSPITAL_COMMUNITY): Payer: PPO

## 2021-02-23 DIAGNOSIS — M4316 Spondylolisthesis, lumbar region: Secondary | ICD-10-CM

## 2021-02-23 DIAGNOSIS — M5416 Radiculopathy, lumbar region: Secondary | ICD-10-CM | POA: Diagnosis not present

## 2021-02-23 DIAGNOSIS — Z419 Encounter for procedure for purposes other than remedying health state, unspecified: Secondary | ICD-10-CM

## 2021-02-23 DIAGNOSIS — Z20822 Contact with and (suspected) exposure to covid-19: Secondary | ICD-10-CM | POA: Diagnosis not present

## 2021-02-23 DIAGNOSIS — M4326 Fusion of spine, lumbar region: Secondary | ICD-10-CM | POA: Diagnosis not present

## 2021-02-23 DIAGNOSIS — Z791 Long term (current) use of non-steroidal anti-inflammatories (NSAID): Secondary | ICD-10-CM | POA: Diagnosis not present

## 2021-02-23 DIAGNOSIS — M48061 Spinal stenosis, lumbar region without neurogenic claudication: Secondary | ICD-10-CM | POA: Diagnosis not present

## 2021-02-23 DIAGNOSIS — M532X6 Spinal instabilities, lumbar region: Secondary | ICD-10-CM | POA: Diagnosis not present

## 2021-02-23 DIAGNOSIS — Z79899 Other long term (current) drug therapy: Secondary | ICD-10-CM | POA: Diagnosis not present

## 2021-02-23 DIAGNOSIS — Z981 Arthrodesis status: Secondary | ICD-10-CM | POA: Diagnosis not present

## 2021-02-23 LAB — SARS CORONAVIRUS 2 BY RT PCR (HOSPITAL ORDER, PERFORMED IN ~~LOC~~ HOSPITAL LAB): SARS Coronavirus 2: NEGATIVE

## 2021-02-23 LAB — ABO/RH: ABO/RH(D): A POS

## 2021-02-23 IMAGING — RF DG LUMBAR SPINE 2-3V
1 series · 5 of 5 positions shown · non-contrast
Comparison: None.

CLINICAL DATA: PLIF L4-L5.

EXAM:
DG C-ARM 1-60 MIN; LUMBAR SPINE - 2-3 VIEW
FLUOROSCOPY TIME:  Fluoroscopy Time:  45 seconds.
Radiation Exposure Index (if provided by the fluoroscopic device):
31.5 mGy.
Number of Acquired Spot Images: 5

[Series 1: run · 5 of 5 slices shown]
[im 1/5]
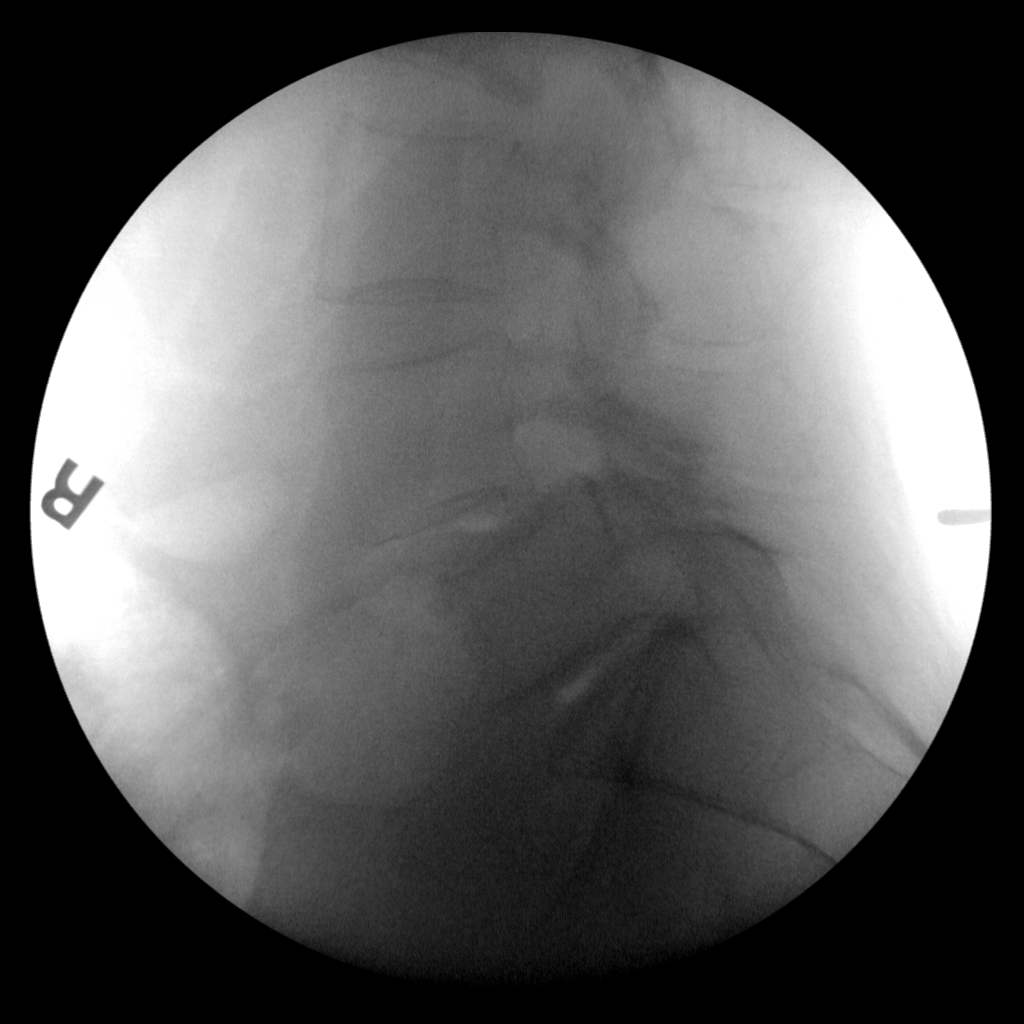
[im 2/5]
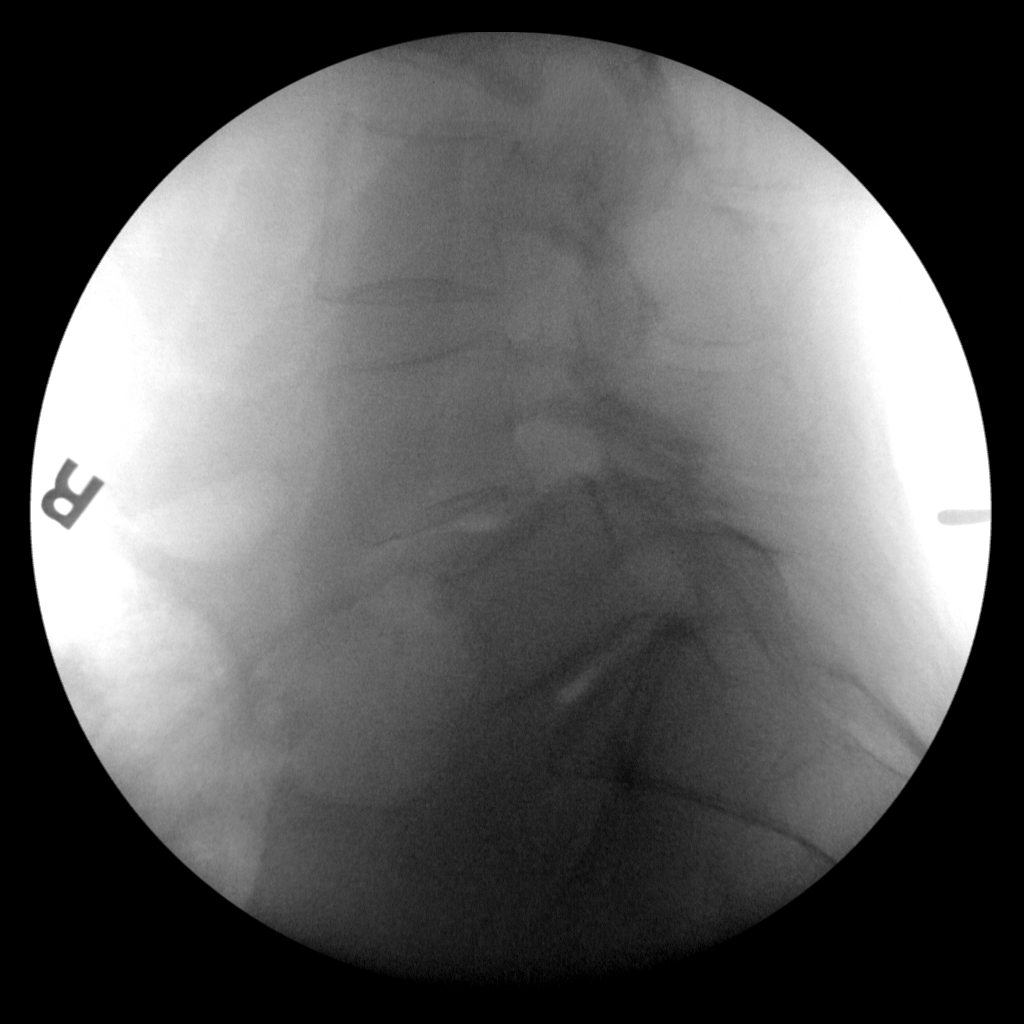
[im 3/5]
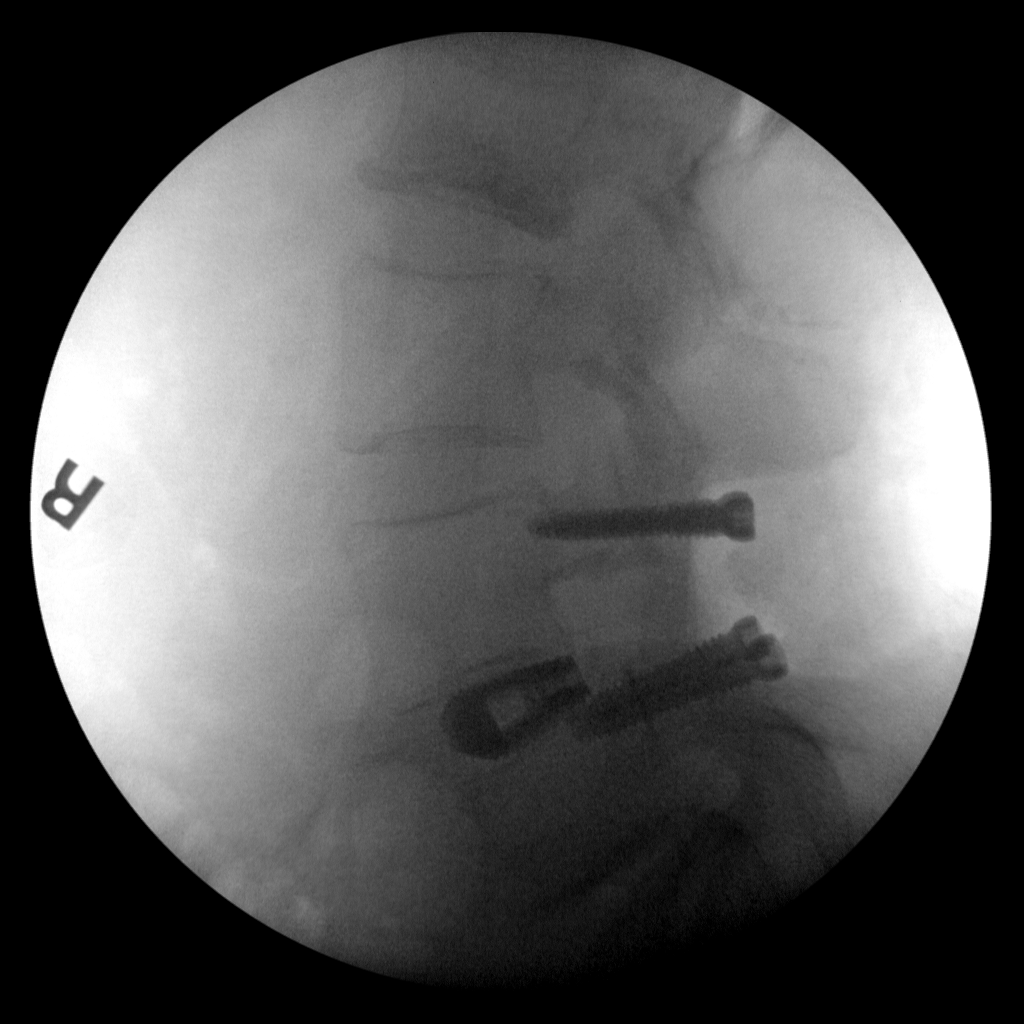
[im 4/5]
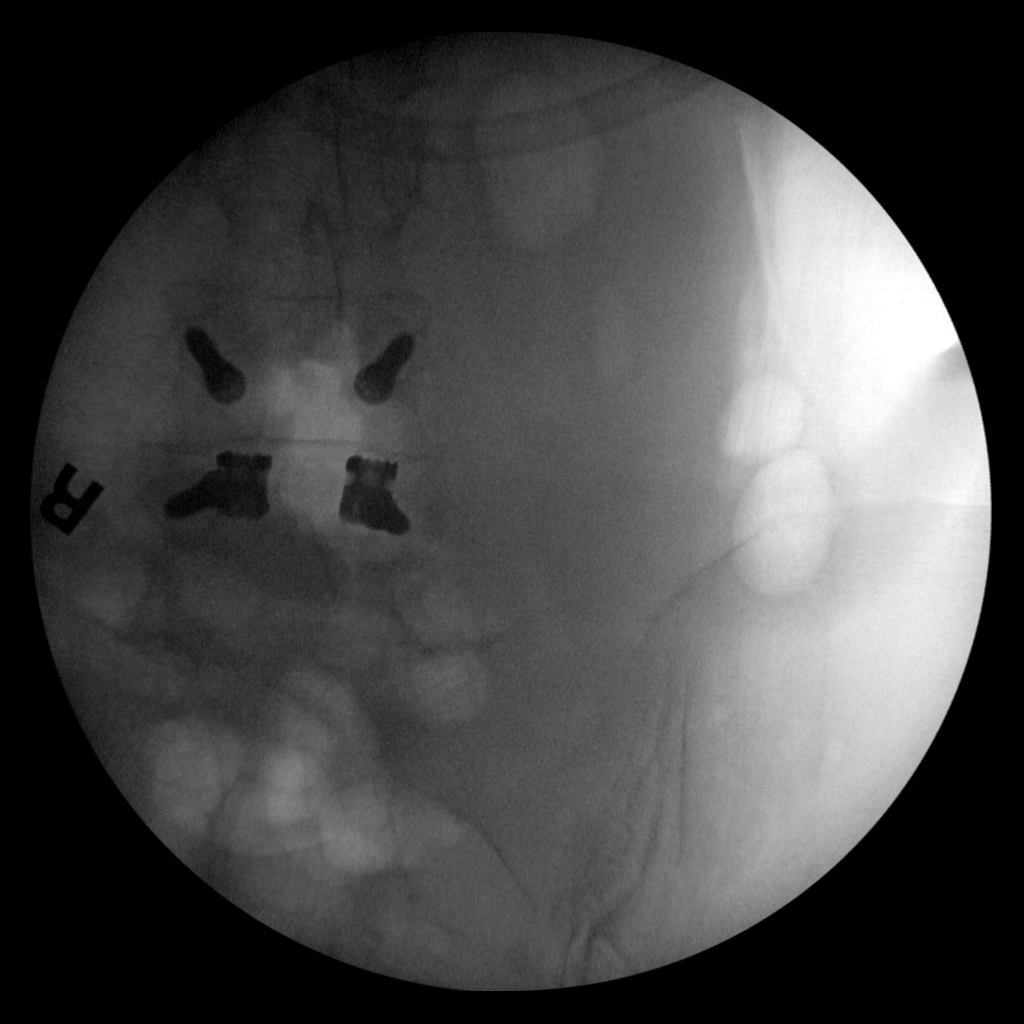
[im 5/5]
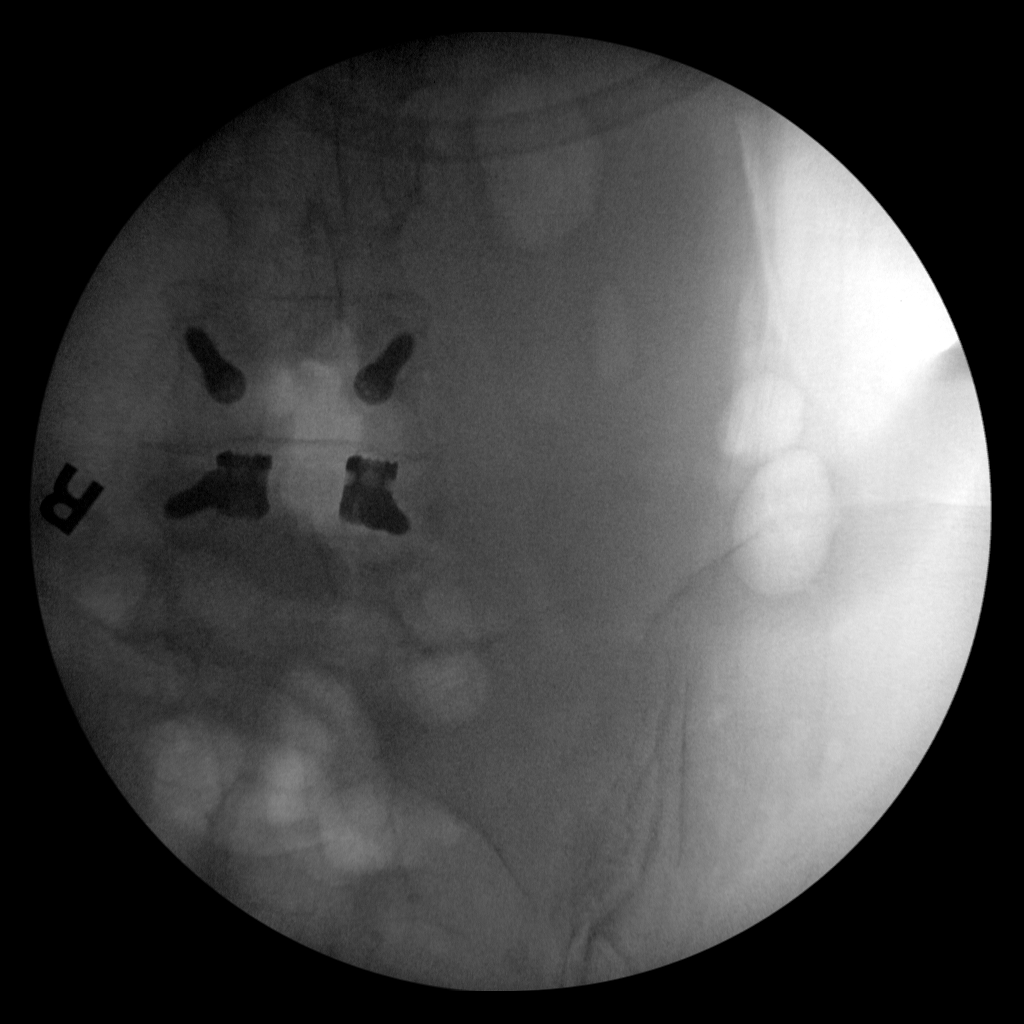

[5 of 5 positions shown; findings below may reference images not displayed]

FINDINGS: Five C-arm fluoroscopic images were obtained intraoperatively and
submitted for post operative interpretation. These images
demonstrate bilateral pedicle screws at L4 and L5 with intervening
L4-L5 spacer. Grade 2 anterolisthesis of L4 on L5. Please see the
performing provider's procedural report for further detail.
IMPRESSION: Intraoperative fluoroscopy, as detailed above.

## 2021-02-23 IMAGING — RF DG C-ARM 1-60 MIN
1 series · 5 of 5 positions shown · non-contrast
Comparison: None.

CLINICAL DATA: PLIF L4-L5.

EXAM:
DG C-ARM 1-60 MIN; LUMBAR SPINE - 2-3 VIEW
FLUOROSCOPY TIME:  Fluoroscopy Time:  45 seconds.
Radiation Exposure Index (if provided by the fluoroscopic device):
31.5 mGy.
Number of Acquired Spot Images: 5

[Series 1: run · 5 of 5 slices shown]
[im 1/5]
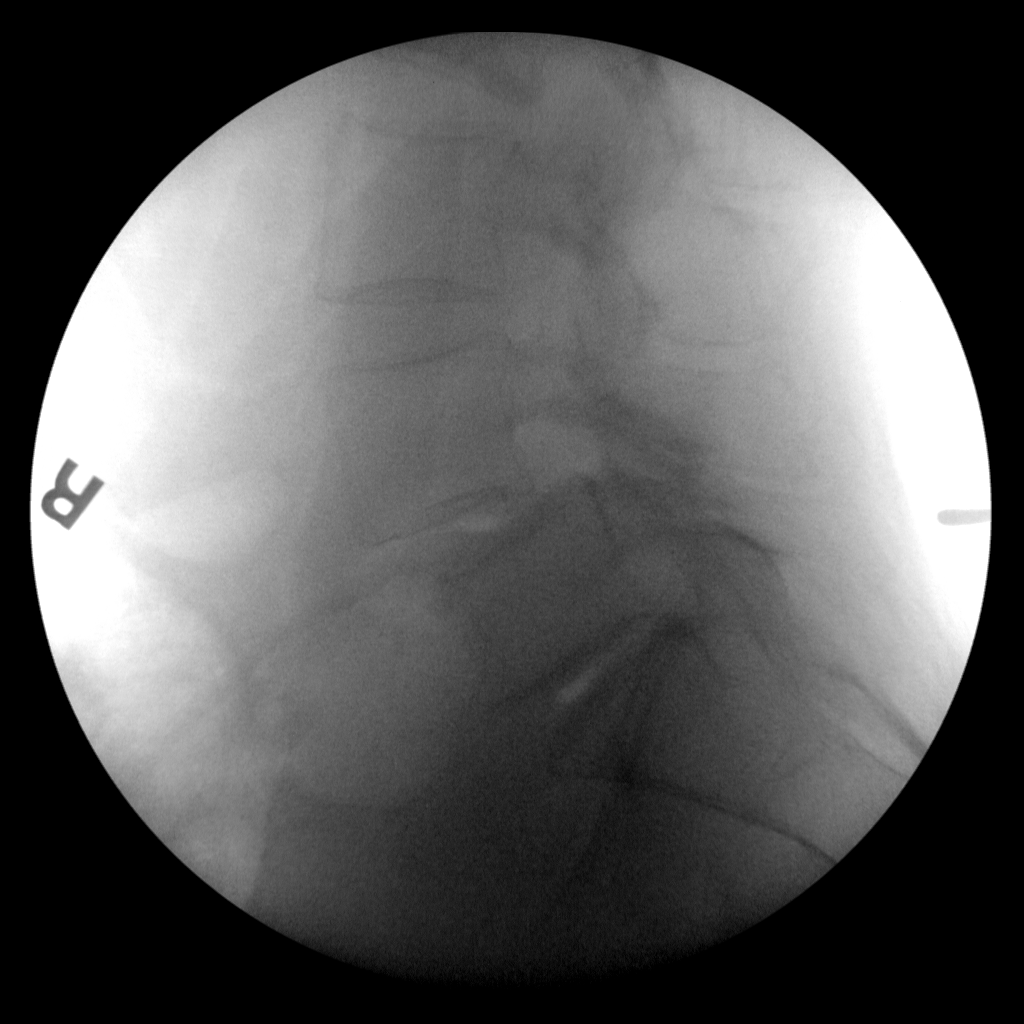
[im 2/5]
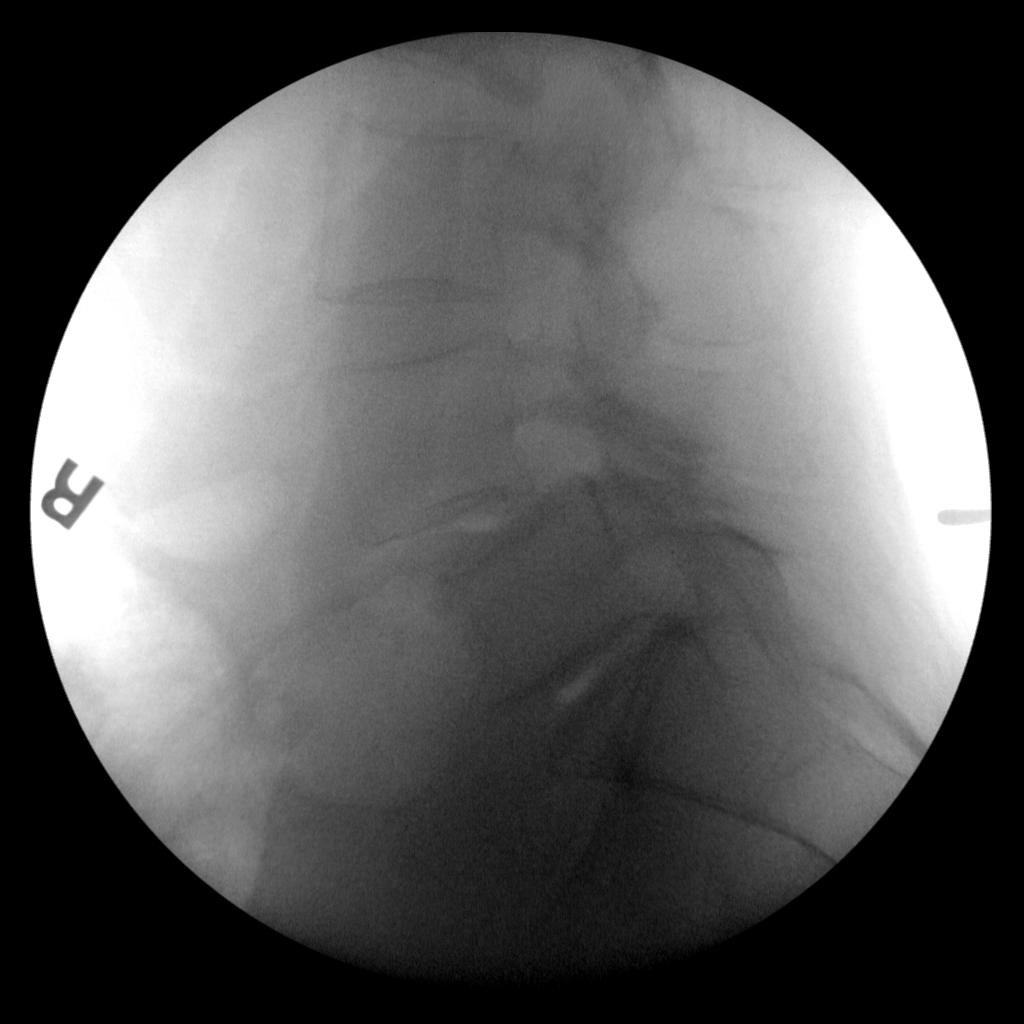
[im 3/5]
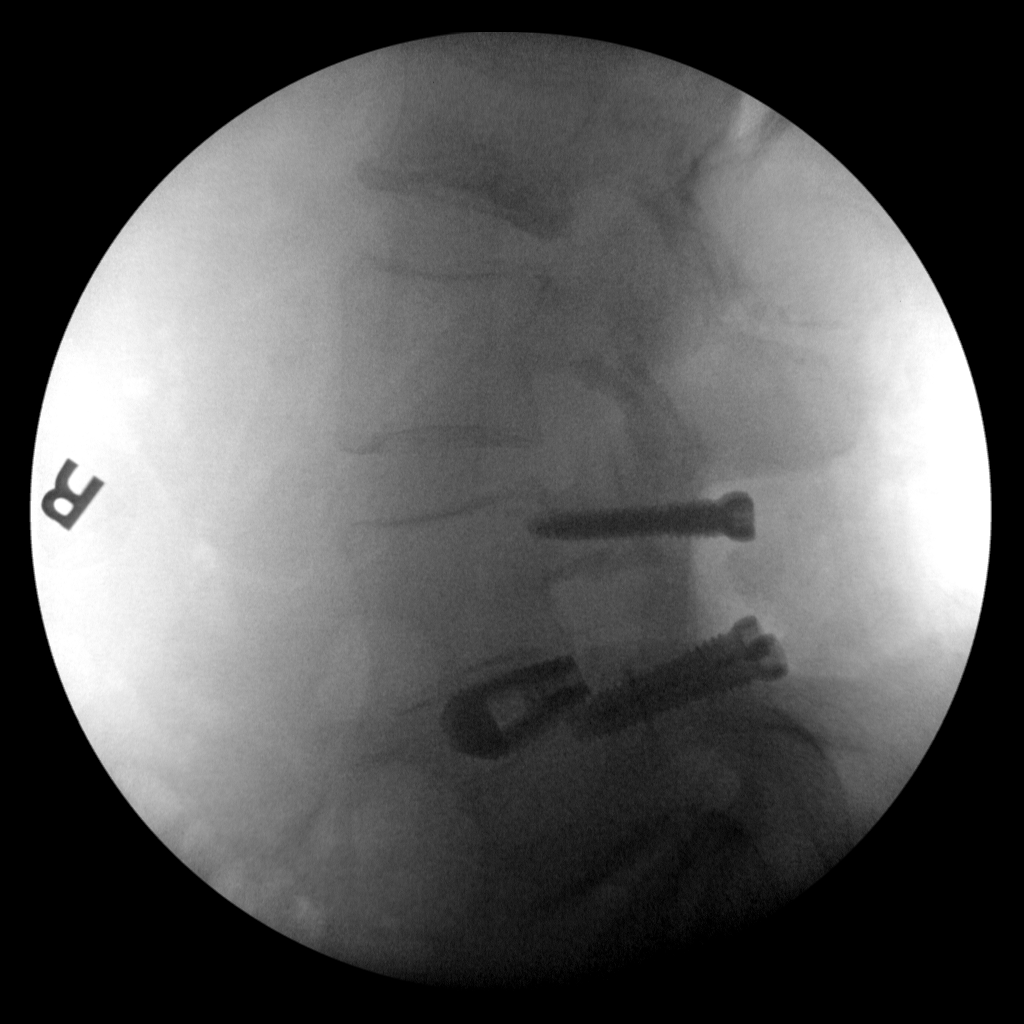
[im 4/5]
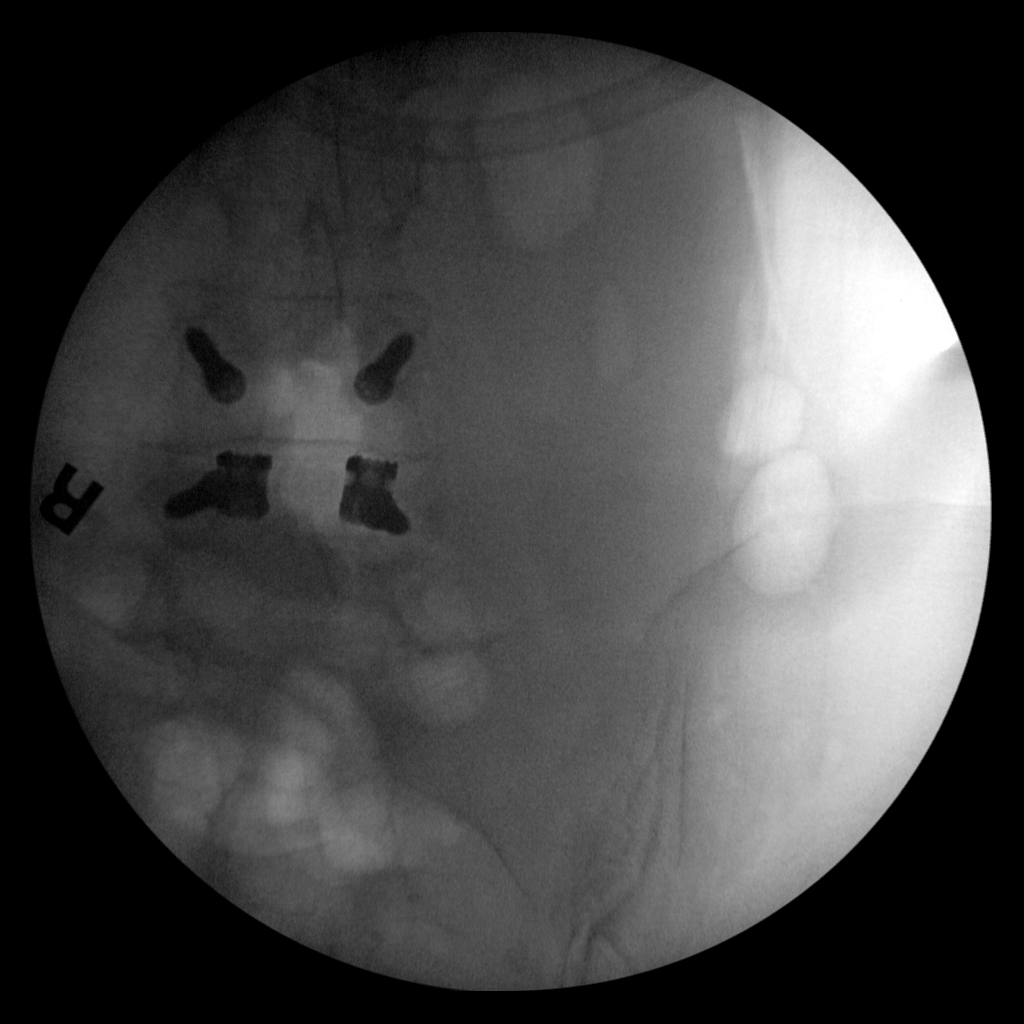
[im 5/5]
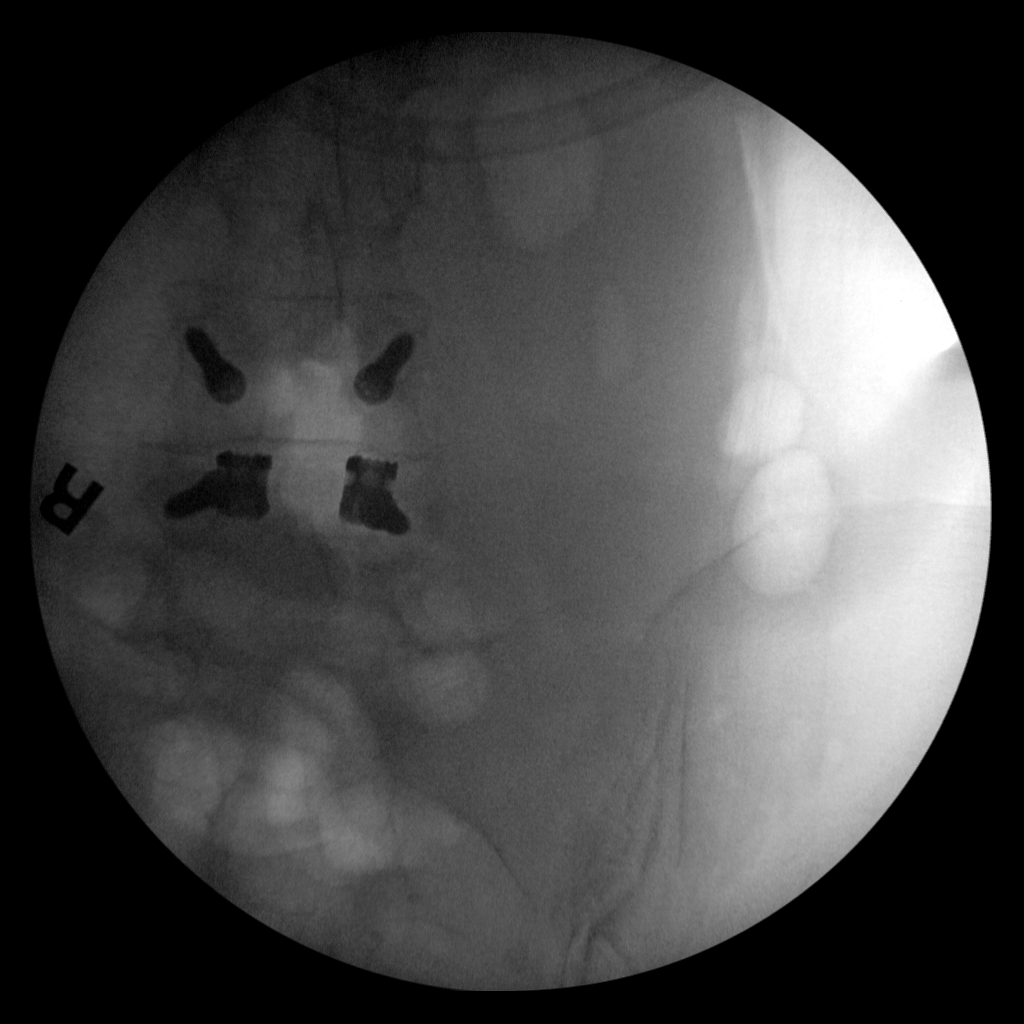

[5 of 5 positions shown; findings below may reference images not displayed]

FINDINGS: Five C-arm fluoroscopic images were obtained intraoperatively and
submitted for post operative interpretation. These images
demonstrate bilateral pedicle screws at L4 and L5 with intervening
L4-L5 spacer. Grade 2 anterolisthesis of L4 on L5. Please see the
performing provider's procedural report for further detail.
IMPRESSION: Intraoperative fluoroscopy, as detailed above.

## 2021-02-23 SURGERY — POSTERIOR LUMBAR FUSION 1 LEVEL
Anesthesia: General | Site: Back

## 2021-02-23 MED ORDER — PROPOFOL 10 MG/ML IV BOLUS
INTRAVENOUS | Status: AC
  Filled 2021-02-23: qty 20

## 2021-02-23 MED ORDER — PHENOL 1.4 % MT LIQD: 1.0000 | OROMUCOSAL | Status: DC | PRN

## 2021-02-23 MED ORDER — FENTANYL CITRATE (PF) 250 MCG/5ML IJ SOLN
INTRAMUSCULAR | Status: AC
  Filled 2021-02-23: qty 5

## 2021-02-23 MED ORDER — POTASSIUM 99 MG PO TABS: 99.0000 mg | ORAL_TABLET | Freq: Every day | ORAL | Status: DC

## 2021-02-23 MED ORDER — ACETAMINOPHEN 650 MG RE SUPP: 650.0000 mg | RECTAL | Status: DC | PRN

## 2021-02-23 MED ORDER — MORPHINE SULFATE (PF) 2 MG/ML IV SOLN: 2.0000 mg | INTRAVENOUS | Status: DC | PRN

## 2021-02-23 MED ORDER — OXYCODONE HCL 5 MG PO TABS
10.0000 mg | ORAL_TABLET | ORAL | Status: DC | PRN
  Administered 2021-02-23: 10 mg via ORAL
  Filled 2021-02-23 (×2): qty 2

## 2021-02-23 MED ORDER — CHLORHEXIDINE GLUCONATE CLOTH 2 % EX PADS: 6.0000 | MEDICATED_PAD | Freq: Once | CUTANEOUS | Status: DC

## 2021-02-23 MED ORDER — THROMBIN 5000 UNITS EX SOLR: OROMUCOSAL | Status: DC | PRN

## 2021-02-23 MED ORDER — CYCLOBENZAPRINE HCL 10 MG PO TABS
10.0000 mg | ORAL_TABLET | Freq: Three times a day (TID) | ORAL | Status: DC | PRN
  Administered 2021-02-23: 10 mg via ORAL
  Filled 2021-02-23 (×2): qty 1

## 2021-02-23 MED ORDER — GLUCOSAMINE-CHONDROITIN PO CAPS: ORAL_CAPSULE | Freq: Every day | ORAL | Status: DC

## 2021-02-23 MED ORDER — ONDANSETRON HCL 4 MG/2ML IJ SOLN: 4.0000 mg | Freq: Four times a day (QID) | INTRAMUSCULAR | Status: DC | PRN

## 2021-02-23 MED ORDER — DEXAMETHASONE SODIUM PHOSPHATE 10 MG/ML IJ SOLN
10.0000 mg | Freq: Once | INTRAMUSCULAR | Status: AC
  Administered 2021-02-23: 10 mg via INTRAVENOUS

## 2021-02-23 MED ORDER — ACETAMINOPHEN 500 MG PO TABS: 1000.0000 mg | ORAL_TABLET | Freq: Once | ORAL | Status: DC | PRN

## 2021-02-23 MED ORDER — ACETAMINOPHEN 10 MG/ML IV SOLN
INTRAVENOUS | Status: AC
  Filled 2021-02-23: qty 100

## 2021-02-23 MED ORDER — ROCURONIUM BROMIDE 10 MG/ML (PF) SYRINGE
PREFILLED_SYRINGE | INTRAVENOUS | Status: AC
  Filled 2021-02-23: qty 10

## 2021-02-23 MED ORDER — CELECOXIB 200 MG PO CAPS
200.0000 mg | ORAL_CAPSULE | Freq: Two times a day (BID) | ORAL | Status: DC
  Administered 2021-02-24: 200 mg via ORAL
  Filled 2021-02-23 (×2): qty 1

## 2021-02-23 MED ORDER — SODIUM CHLORIDE 0.9 % IV SOLN: 250.0000 mL | INTRAVENOUS | Status: DC

## 2021-02-23 MED ORDER — BUPIVACAINE LIPOSOME 1.3 % IJ SUSP
INTRAMUSCULAR | Status: DC | PRN
  Administered 2021-02-23: 20 mL

## 2021-02-23 MED ORDER — ONDANSETRON HCL 4 MG PO TABS: 4.0000 mg | ORAL_TABLET | Freq: Four times a day (QID) | ORAL | Status: DC | PRN

## 2021-02-23 MED ORDER — LORATADINE 10 MG PO TABS
10.0000 mg | ORAL_TABLET | Freq: Every day | ORAL | Status: DC
  Administered 2021-02-23 – 2021-02-24 (×2): 10 mg via ORAL
  Filled 2021-02-23 (×2): qty 1

## 2021-02-23 MED ORDER — DEXAMETHASONE SODIUM PHOSPHATE 10 MG/ML IJ SOLN
INTRAMUSCULAR | Status: AC
  Filled 2021-02-23: qty 1

## 2021-02-23 MED ORDER — KRILL OIL 500 MG PO CAPS: 500.0000 mg | ORAL_CAPSULE | Freq: Every day | ORAL | Status: DC

## 2021-02-23 MED ORDER — LIDOCAINE-EPINEPHRINE 1 %-1:100000 IJ SOLN
INTRAMUSCULAR | Status: AC
  Filled 2021-02-23: qty 1

## 2021-02-23 MED ORDER — LIDOCAINE 2% (20 MG/ML) 5 ML SYRINGE
INTRAMUSCULAR | Status: DC | PRN
  Administered 2021-02-23: 50 mg via INTRAVENOUS

## 2021-02-23 MED ORDER — LIDOCAINE 2% (20 MG/ML) 5 ML SYRINGE
INTRAMUSCULAR | Status: AC
  Filled 2021-02-23: qty 5

## 2021-02-23 MED ORDER — CYCLOBENZAPRINE HCL 10 MG PO TABS: 10.0000 mg | ORAL_TABLET | Freq: Every evening | ORAL | Status: DC | PRN

## 2021-02-23 MED ORDER — OXYCODONE HCL 5 MG/5ML PO SOLN: 5.0000 mg | Freq: Once | ORAL | Status: DC | PRN

## 2021-02-23 MED ORDER — FENTANYL CITRATE (PF) 100 MCG/2ML IJ SOLN
INTRAMUSCULAR | Status: DC | PRN
  Administered 2021-02-23: 50 ug via INTRAVENOUS
  Administered 2021-02-23: 150 ug via INTRAVENOUS

## 2021-02-23 MED ORDER — VITAMIN D 25 MCG (1000 UNIT) PO TABS
2000.0000 [IU] | ORAL_TABLET | Freq: Every day | ORAL | Status: DC
  Administered 2021-02-23 – 2021-02-24 (×2): 2000 [IU] via ORAL
  Filled 2021-02-23 (×2): qty 2

## 2021-02-23 MED ORDER — 0.9 % SODIUM CHLORIDE (POUR BTL) OPTIME
TOPICAL | Status: DC | PRN
  Administered 2021-02-23: 1000 mL

## 2021-02-23 MED ORDER — VITAMIN B-12 1000 MCG PO TABS
1000.0000 ug | ORAL_TABLET | Freq: Every day | ORAL | Status: DC
  Administered 2021-02-23 – 2021-02-24 (×2): 1000 ug via ORAL
  Filled 2021-02-23 (×2): qty 1

## 2021-02-23 MED ORDER — CEFAZOLIN SODIUM-DEXTROSE 2-4 GM/100ML-% IV SOLN
2.0000 g | Freq: Three times a day (TID) | INTRAVENOUS | Status: AC
  Administered 2021-02-23 – 2021-02-24 (×2): 2 g via INTRAVENOUS
  Filled 2021-02-23 (×2): qty 100

## 2021-02-23 MED ORDER — PROPOFOL 10 MG/ML IV BOLUS
INTRAVENOUS | Status: DC | PRN
  Administered 2021-02-23: 100 mg via INTRAVENOUS
  Administered 2021-02-23: 50 mg via INTRAVENOUS

## 2021-02-23 MED ORDER — CHLORHEXIDINE GLUCONATE 0.12 % MT SOLN
OROMUCOSAL | Status: AC
  Administered 2021-02-23: 15 mL
  Filled 2021-02-23: qty 15

## 2021-02-23 MED ORDER — CEFAZOLIN SODIUM-DEXTROSE 2-4 GM/100ML-% IV SOLN
INTRAVENOUS | Status: AC
  Filled 2021-02-23: qty 100

## 2021-02-23 MED ORDER — MENTHOL 3 MG MT LOZG: 1.0000 | LOZENGE | OROMUCOSAL | Status: DC | PRN

## 2021-02-23 MED ORDER — PANTOPRAZOLE SODIUM 40 MG PO TBEC
40.0000 mg | DELAYED_RELEASE_TABLET | Freq: Every day | ORAL | Status: DC
  Administered 2021-02-24: 40 mg via ORAL
  Filled 2021-02-23 (×2): qty 1

## 2021-02-23 MED ORDER — SODIUM CHLORIDE 0.9% FLUSH: 3.0000 mL | Freq: Two times a day (BID) | INTRAVENOUS | Status: DC

## 2021-02-23 MED ORDER — TRAMADOL HCL 50 MG PO TABS
50.0000 mg | ORAL_TABLET | Freq: Every evening | ORAL | Status: DC | PRN
  Administered 2021-02-24: 50 mg via ORAL
  Filled 2021-02-23 (×2): qty 1

## 2021-02-23 MED ORDER — ACETAMINOPHEN 325 MG PO TABS
650.0000 mg | ORAL_TABLET | ORAL | Status: DC | PRN
  Administered 2021-02-23 – 2021-02-24 (×2): 650 mg via ORAL
  Filled 2021-02-23 (×2): qty 2

## 2021-02-23 MED ORDER — TURMERIC 500 MG PO CAPS: ORAL_CAPSULE | Freq: Every day | ORAL | Status: DC

## 2021-02-23 MED ORDER — THROMBIN 20000 UNITS EX SOLR
CUTANEOUS | Status: DC | PRN
  Administered 2021-02-23: 20 mL via TOPICAL

## 2021-02-23 MED ORDER — SUGAMMADEX SODIUM 200 MG/2ML IV SOLN
INTRAVENOUS | Status: DC | PRN
  Administered 2021-02-23: 150 mg via INTRAVENOUS

## 2021-02-23 MED ORDER — PHENYLEPHRINE 40 MCG/ML (10ML) SYRINGE FOR IV PUSH (FOR BLOOD PRESSURE SUPPORT)
PREFILLED_SYRINGE | INTRAVENOUS | Status: DC | PRN
  Administered 2021-02-23 (×2): 40 ug via INTRAVENOUS

## 2021-02-23 MED ORDER — LACTATED RINGERS IV SOLN: INTRAVENOUS | Status: DC | PRN

## 2021-02-23 MED ORDER — GABAPENTIN 300 MG PO CAPS
300.0000 mg | ORAL_CAPSULE | Freq: Every day | ORAL | Status: DC
  Filled 2021-02-23: qty 1

## 2021-02-23 MED ORDER — APOAEQUORIN 10 MG PO CAPS: ORAL_CAPSULE | Freq: Every day | ORAL | Status: DC

## 2021-02-23 MED ORDER — PANTOPRAZOLE SODIUM 40 MG IV SOLR: 40.0000 mg | Freq: Every day | INTRAVENOUS | Status: DC

## 2021-02-23 MED ORDER — FENTANYL CITRATE (PF) 100 MCG/2ML IJ SOLN: 25.0000 ug | INTRAMUSCULAR | Status: DC | PRN

## 2021-02-23 MED ORDER — PHENYLEPHRINE 40 MCG/ML (10ML) SYRINGE FOR IV PUSH (FOR BLOOD PRESSURE SUPPORT)
PREFILLED_SYRINGE | INTRAVENOUS | Status: AC
  Filled 2021-02-23: qty 10

## 2021-02-23 MED ORDER — HAIR/SKIN/NAILS PO TABS: ORAL_TABLET | Freq: Every day | ORAL | Status: DC

## 2021-02-23 MED ORDER — ACETAMINOPHEN 10 MG/ML IV SOLN
INTRAVENOUS | Status: DC | PRN
  Administered 2021-02-23: 1000 mg via INTRAVENOUS

## 2021-02-23 MED ORDER — OXYCODONE HCL 5 MG PO TABS
5.0000 mg | ORAL_TABLET | Freq: Once | ORAL | Status: DC | PRN
Start: 2021-02-23 — End: 2021-02-23

## 2021-02-23 MED ORDER — ROCURONIUM BROMIDE 10 MG/ML (PF) SYRINGE
PREFILLED_SYRINGE | INTRAVENOUS | Status: DC | PRN
  Administered 2021-02-23: 60 mg via INTRAVENOUS

## 2021-02-23 MED ORDER — ACETAMINOPHEN 10 MG/ML IV SOLN: 1000.0000 mg | Freq: Once | INTRAVENOUS | Status: DC | PRN

## 2021-02-23 MED ORDER — ACETAMINOPHEN 160 MG/5ML PO SOLN: 1000.0000 mg | Freq: Once | ORAL | Status: DC | PRN

## 2021-02-23 MED ORDER — ONDANSETRON HCL 4 MG/2ML IJ SOLN
INTRAMUSCULAR | Status: AC
  Filled 2021-02-23: qty 2

## 2021-02-23 MED ORDER — LIDOCAINE-EPINEPHRINE 1 %-1:100000 IJ SOLN
INTRAMUSCULAR | Status: DC | PRN
  Administered 2021-02-23: 10 mL

## 2021-02-23 MED ORDER — SODIUM CHLORIDE 0.9% FLUSH: 3.0000 mL | INTRAVENOUS | Status: DC | PRN

## 2021-02-23 MED ORDER — THROMBIN 20000 UNITS EX SOLR
CUTANEOUS | Status: AC
  Filled 2021-02-23: qty 20000

## 2021-02-23 MED ORDER — ALUM & MAG HYDROXIDE-SIMETH 200-200-20 MG/5ML PO SUSP: 30.0000 mL | Freq: Four times a day (QID) | ORAL | Status: DC | PRN

## 2021-02-23 MED ORDER — ADULT MULTIVITAMIN W/MINERALS CH
1.0000 | ORAL_TABLET | Freq: Every day | ORAL | Status: DC
  Administered 2021-02-23 – 2021-02-24 (×2): 1 via ORAL
  Filled 2021-02-23 (×2): qty 1

## 2021-02-23 MED ORDER — CEFAZOLIN SODIUM-DEXTROSE 2-4 GM/100ML-% IV SOLN
2.0000 g | INTRAVENOUS | Status: AC
  Administered 2021-02-23: 2 g via INTRAVENOUS

## 2021-02-23 MED ORDER — BUPIVACAINE LIPOSOME 1.3 % IJ SUSP
INTRAMUSCULAR | Status: AC
  Filled 2021-02-23: qty 20

## 2021-02-23 MED ORDER — ONDANSETRON HCL 4 MG/2ML IJ SOLN
INTRAMUSCULAR | Status: DC | PRN
  Administered 2021-02-23: 4 mg via INTRAVENOUS

## 2021-02-23 SURGICAL SUPPLY — 79 items
ADH SKN CLS APL DERMABOND .7 (GAUZE/BANDAGES/DRESSINGS) ×1
APL SKNCLS STERI-STRIP NONHPOA (GAUZE/BANDAGES/DRESSINGS) ×1
BASKET BONE COLLECTION (BASKET) ×2 IMPLANT
BENZOIN TINCTURE PRP APPL 2/3 (GAUZE/BANDAGES/DRESSINGS) ×2 IMPLANT
BLADE CLIPPER SURG (BLADE) IMPLANT
BLADE SURG 11 STRL SS (BLADE) ×2 IMPLANT
BONE VIVIGEN FORMABLE 5.4CC (Bone Implant) ×2 IMPLANT
BUR CUTTER 7.0 ROUND (BURR) ×2 IMPLANT
BUR MATCHSTICK NEURO 3.0 LAGG (BURR) ×2 IMPLANT
CANISTER SUCT 3000ML PPV (MISCELLANEOUS) ×2 IMPLANT
CAP LOCKING THREADED (Cap) ×4 IMPLANT
CARTRIDGE OIL MAESTRO DRILL (MISCELLANEOUS) ×1 IMPLANT
CATH FOLEY 2WAY SLVR  5CC 12FR (CATHETERS) ×2
CATH FOLEY 2WAY SLVR 5CC 12FR (CATHETERS) IMPLANT
CNTNR URN SCR LID CUP LEK RST (MISCELLANEOUS) ×1 IMPLANT
CONT SPEC 4OZ STRL OR WHT (MISCELLANEOUS) ×2
COVER BACK TABLE 60X90IN (DRAPES) ×2 IMPLANT
COVER WAND RF STERILE (DRAPES) ×2 IMPLANT
DECANTER SPIKE VIAL GLASS SM (MISCELLANEOUS) ×2 IMPLANT
DERMABOND ADVANCED (GAUZE/BANDAGES/DRESSINGS) ×1
DERMABOND ADVANCED .7 DNX12 (GAUZE/BANDAGES/DRESSINGS) ×1 IMPLANT
DIFFUSER DRILL AIR PNEUMATIC (MISCELLANEOUS) ×2 IMPLANT
DRAPE C-ARM 42X72 X-RAY (DRAPES) ×4 IMPLANT
DRAPE C-ARMOR (DRAPES) IMPLANT
DRAPE HALF SHEET 40X57 (DRAPES) IMPLANT
DRAPE LAPAROTOMY 100X72X124 (DRAPES) ×2 IMPLANT
DRAPE SURG 17X23 STRL (DRAPES) ×2 IMPLANT
DRSG OPSITE 4X5.5 SM (GAUZE/BANDAGES/DRESSINGS) ×2 IMPLANT
DRSG OPSITE POSTOP 4X6 (GAUZE/BANDAGES/DRESSINGS) ×2 IMPLANT
DRSG OPSITE POSTOP 4X8 (GAUZE/BANDAGES/DRESSINGS) ×1 IMPLANT
DURAPREP 26ML APPLICATOR (WOUND CARE) ×2 IMPLANT
ELECT REM PT RETURN 9FT ADLT (ELECTROSURGICAL) ×2
ELECTRODE REM PT RTRN 9FT ADLT (ELECTROSURGICAL) ×1 IMPLANT
EVACUATOR 1/8 PVC DRAIN (DRAIN) ×1 IMPLANT
EVACUATOR 3/16  PVC DRAIN (DRAIN)
EVACUATOR 3/16 PVC DRAIN (DRAIN) IMPLANT
GAUZE 4X4 16PLY RFD (DISPOSABLE) IMPLANT
GAUZE SPONGE 4X4 12PLY STRL (GAUZE/BANDAGES/DRESSINGS) ×2 IMPLANT
GAUZE SPONGE 4X4 12PLY STRL LF (GAUZE/BANDAGES/DRESSINGS) ×1 IMPLANT
GLOVE BIO SURGEON STRL SZ7 (GLOVE) IMPLANT
GLOVE BIO SURGEON STRL SZ8 (GLOVE) ×4 IMPLANT
GLOVE EXAM NITRILE XL STR (GLOVE) IMPLANT
GLOVE INDICATOR 8.5 STRL (GLOVE) ×4 IMPLANT
GLOVE SURG UNDER POLY LF SZ7 (GLOVE) IMPLANT
GOWN STRL REUS W/ TWL LRG LVL3 (GOWN DISPOSABLE) IMPLANT
GOWN STRL REUS W/ TWL XL LVL3 (GOWN DISPOSABLE) ×2 IMPLANT
GOWN STRL REUS W/TWL 2XL LVL3 (GOWN DISPOSABLE) IMPLANT
GOWN STRL REUS W/TWL LRG LVL3 (GOWN DISPOSABLE)
GOWN STRL REUS W/TWL XL LVL3 (GOWN DISPOSABLE) ×4
GRAFT BNE MATRIX VG FRMBL MD 5 (Bone Implant) IMPLANT
GRAFT BONE PROTEIOS LRG 5CC (Orthopedic Implant) ×1 IMPLANT
HEMOSTAT POWDER KIT SURGIFOAM (HEMOSTASIS) ×2 IMPLANT
KIT BASIN OR (CUSTOM PROCEDURE TRAY) ×2 IMPLANT
KIT TURNOVER KIT B (KITS) ×2 IMPLANT
MILL MEDIUM DISP (BLADE) ×2 IMPLANT
NDL HYPO 21X1.5 SAFETY (NEEDLE) IMPLANT
NDL HYPO 25X1 1.5 SAFETY (NEEDLE) ×1 IMPLANT
NEEDLE HYPO 21X1.5 SAFETY (NEEDLE) ×2 IMPLANT
NEEDLE HYPO 25X1 1.5 SAFETY (NEEDLE) ×2 IMPLANT
NS IRRIG 1000ML POUR BTL (IV SOLUTION) ×2 IMPLANT
OIL CARTRIDGE MAESTRO DRILL (MISCELLANEOUS) ×2
PACK LAMINECTOMY NEURO (CUSTOM PROCEDURE TRAY) ×2 IMPLANT
PAD ARMBOARD 7.5X6 YLW CONV (MISCELLANEOUS) ×6 IMPLANT
ROD SPINAL 35MM (Rod) ×2 IMPLANT
SCREW PA THRD CREO TULIP 5.5X4 (Head) ×4 IMPLANT
SHAFT CREO 30MM (Neuro Prosthesis/Implant) ×4 IMPLANT
SPACER SUSTAIN TI 9X22X12 15D (Spacer) ×2 IMPLANT
SPONGE LAP 4X18 RFD (DISPOSABLE) IMPLANT
SPONGE SURGIFOAM ABS GEL 100 (HEMOSTASIS) ×2 IMPLANT
STRIP CLOSURE SKIN 1/2X4 (GAUZE/BANDAGES/DRESSINGS) ×3 IMPLANT
SUT VIC AB 0 CT1 18XCR BRD8 (SUTURE) ×2 IMPLANT
SUT VIC AB 0 CT1 8-18 (SUTURE) ×4
SUT VIC AB 2-0 CT1 18 (SUTURE) ×2 IMPLANT
SUT VIC AB 4-0 PS2 27 (SUTURE) ×2 IMPLANT
SYR 20CC LL (SYRINGE) ×1 IMPLANT
TOWEL GREEN STERILE (TOWEL DISPOSABLE) ×2 IMPLANT
TOWEL GREEN STERILE FF (TOWEL DISPOSABLE) ×2 IMPLANT
TRAY FOLEY MTR SLVR 16FR STAT (SET/KITS/TRAYS/PACK) ×2 IMPLANT
WATER STERILE IRR 1000ML POUR (IV SOLUTION) ×2 IMPLANT

## 2021-02-23 NOTE — H&P (Signed)
Stephanie Mckee is an 80 y.o. female.   Chief Complaint: Back and right greater than left leg pain HPI: 34-year female with longstanding back and bilateral hip and leg pain work-up has revealed a progressive spondylolisthesis from grade 1 to grade 2 at L4-5 with severe spinal stenosis at that level.  She is failed all forms conservative treatment over the years with physical therapy epidural steroid injections time anti-inflammatories.  And due to her progression of clinical syndrome imaging findings and failed conservative treatment I recommended decompression stabilization procedure at L4-5.  I have extensively reviewed the risks and benefits of that operation with her as well as perioperative course expectations of outcome and alternatives of surgery and she understands and agrees to proceed forward.  Past Medical History:  Diagnosis Date  . Heart murmur   . Osteoporosis   . Thoracic degenerative disc disease   . Thyroid nodule     Past Surgical History:  Procedure Laterality Date  . CATARACT EXTRACTION Right   . thumb surgery      Family History  Problem Relation Age of Onset  . Breast cancer Maternal Aunt   . Breast cancer Daughter   . Dementia Mother   . Renal cancer Father   . Bone cancer Father   . Multiple myeloma Father   . Healthy Sister    Social History:  reports that she has never smoked. She has never used smokeless tobacco. She reports current alcohol use. She reports that she does not use drugs.  Allergies:  Allergies  Allergen Reactions  . Actonel [Risedronate Sodium]     Decreased bone density   . Hydromorphone Nausea And Vomiting  . Prochlorperazine Other (See Comments)    Muscle seizure (drooling, tongue turns sideways; "weird things")    Medications Prior to Admission  Medication Sig Dispense Refill  . Apoaequorin (PREVAGEN PO) Take 1 capsule by mouth daily.    . Biotin w/ Vitamins C & E (HAIR/SKIN/NAILS PO) Take 1 tablet by mouth daily.    .  celecoxib (CELEBREX) 200 MG capsule Take 200 mg by mouth 2 (two) times daily.    . cetirizine (ZYRTEC) 10 MG tablet Take 10 mg by mouth daily.    . Cholecalciferol (VITAMIN D) 50 MCG (2000 UT) CAPS Take 2,000 Units by mouth daily.    . Cyanocobalamin (VITAMIN B-12) 5000 MCG TBDP Take 5,000 mcg by mouth daily.    . cyclobenzaprine (FLEXERIL) 10 MG tablet Take 10 mg by mouth at bedtime as needed for muscle spasms.    Marland Kitchen gabapentin (NEURONTIN) 300 MG capsule Take 300 mg by mouth at bedtime.    Marland Kitchen GLUCOSAMINE-CHONDROITIN PO Take 1 tablet by mouth daily.    Javier Docker Oil 500 MG CAPS Take 500 mg by mouth daily.    . Multiple Vitamin (MULTIVITAMIN WITH MINERALS) TABS tablet Take 1 tablet by mouth daily.    . Multiple Vitamins-Minerals (PRESERVISION AREDS 2 PO) Take 1 capsule by mouth in the morning and at bedtime.    . Potassium 99 MG TABS Take 99 mg by mouth daily.    . traMADol (ULTRAM) 50 MG tablet Take 50-100 mg by mouth at bedtime as needed for moderate pain.    . TURMERIC PO Take 2 capsules by mouth daily.      Results for orders placed or performed during the hospital encounter of 02/23/21 (from the past 48 hour(s))  SARS Coronavirus 2 by RT PCR (hospital order, performed in [facility] hospital lab) Nasopharyngeal Nasopharyngeal  Swab     Status: None   Collection Time: 02/23/21 10:46 AM   Specimen: Nasopharyngeal Swab  Result Value Ref Range   SARS Coronavirus 2 NEGATIVE NEGATIVE    Comment: (NOTE) SARS-CoV-2 target nucleic acids are NOT DETECTED.  The SARS-CoV-2 RNA is generally detectable in upper and lower respiratory specimens during the acute phase of infection. The lowest concentration of SARS-CoV-2 viral copies this assay can detect is 250 copies / mL. A negative result does not preclude SARS-CoV-2 infection and should not be used as the sole basis for treatment or other patient management decisions.  A negative result may occur with improper specimen collection / handling,  submission of specimen other than nasopharyngeal swab, presence of viral mutation(s) within the areas targeted by this assay, and inadequate number of viral copies (<250 copies / mL). A negative result must be combined with clinical observations, patient history, and epidemiological information.  Fact Sheet for Patients:   StrictlyIdeas.no  Fact Sheet for Healthcare Providers: BankingDealers.co.za  This test is not yet approved or  cleared by the [place] FDA and has been authorized for detection and/or diagnosis of SARS-CoV-2 by FDA under an Emergency Use Authorization (EUA).  This EUA will remain in effect (meaning this test can be used) for the duration of the COVID-19 declaration under Section 564(b)(1) of the Act, 21 U.S.C. section 360bbb-3(b)(1), unless the authorization is terminated or revoked sooner.  Performed at [facility], [place] [address], [place], [place] [address]   ABO/Rh     Status: None (Preliminary result)   Collection Time: 02/23/21 11:21 AM  Result Value Ref Range   ABO/RH(D) PENDING    No results found.  Review of Systems  Blood pressure (!) 143/81, pulse 79, temperature 97.7 F (36.5 C), temperature source Oral, resp. rate 18, height _0  (1.676 m), weight 66.2 kg, SpO2 99 %. Physical Exam   Assessment/Plan 80 year old presents for posterior lumbar interbody fusion L4-5  Elaina Hoops, MD 02/23/2021, 12:11 PM

## 2021-02-23 NOTE — Anesthesia Preprocedure Evaluation (Addendum)
Anesthesia Evaluation  Patient identified by MRN, date of birth, ID band Patient awake    Reviewed: Allergy & Precautions, NPO status , Patient's Chart, lab work & pertinent test results  History of Anesthesia Complications Negative for: history of anesthetic complications  Airway Mallampati: I  TM Distance: >3 FB Neck ROM: Full    Dental  (+) Teeth Intact, Dental Advisory Given   Pulmonary neg pulmonary ROS,  Covid-19 Nucleic Acid Test Results Lab Results      Component                Value               Date                      [place]              NEGATIVE            02/23/2021              breath sounds clear to auscultation       Cardiovascular negative cardio ROS   Rhythm:Regular     Neuro/Psych negative neurological ROS  negative psych ROS   GI/Hepatic negative GI ROS, Neg liver ROS,   Endo/Other  negative endocrine ROS  Renal/GU negative Renal ROS     Musculoskeletal  (+) Arthritis ,   Abdominal   Peds  Hematology   Anesthesia Other Findings   Reproductive/Obstetrics                             Anesthesia Physical Anesthesia Plan  ASA: I  Anesthesia Plan: General   Post-op Pain Management:    Induction: Intravenous  PONV Risk Score and Plan: 3 and Ondansetron and Dexamethasone  Airway Management Planned: Oral ETT  Additional Equipment: None  Intra-op Plan:   Post-operative Plan: Extubation in OR  Informed Consent: I have reviewed the patients History and Physical, chart, labs and discussed the procedure including the risks, benefits and alternatives for the proposed anesthesia with the patient or authorized representative who has indicated his/her understanding and acceptance.     Dental advisory given  Plan Discussed with: CRNA and Surgeon  Anesthesia Plan Comments:         Anesthesia Quick Evaluation

## 2021-02-23 NOTE — Transfer of Care (Signed)
Immediate Anesthesia Transfer of Care Note  Patient: Stephanie Mckee  Procedure(s) Performed: Posterior Lumbar Interbody Fusion - Lumbar Four- Lumbar Five (N/A Back)  Patient Location: PACU  Anesthesia Type:General  Level of Consciousness: oriented, drowsy and patient cooperative  Airway & Oxygen Therapy: Patient Spontanous Breathing and Patient connected to face mask oxygen  Post-op Assessment: Report given to RN and Post -op Vital signs reviewed and stable  Post vital signs: Reviewed  Last Vitals:  Vitals Value Taken Time  BP 127/78 02/23/21 1618  Temp    Pulse 79 02/23/21 1620  Resp 13 02/23/21 1620  SpO2 100 % 02/23/21 1620  Vitals shown include unvalidated device data.  Last Pain:  Vitals:   02/23/21 1041  TempSrc: Oral         Complications: No complications documented.

## 2021-02-23 NOTE — Op Note (Signed)
Preoperative diagnosis: Grade 1/2 spondylolisthesis L4-5 with severe spinal stenosis bilateral L4-L5 radiculopathies and instability  Postoperative diagnosis: Same  Procedure: #1 decompressive lumbar laminectomy with complete medial facetectomies were added foraminotomies the L4 and L5 nerve roots in excess and requiring more work than would be needed with a standard interbody fusion  2.  Posterior lumbar interbody fusion L4-5 utilizing the globus insert and rotate titanium cages packed with locally harvested autograft mixed with vivigen and Proteus  3.  Cortical screw fixation utilizing the globus Creo amp modular cortical screw set with threaded nuts L4-5  Surgeon: Dominica Severin Markeesha Char  Assistant: Duffy Rhody  Anesthesia: General  EBL: Minimal  HPI: 80 year old female progressive worsening back bilateral hip and leg pain work-up revealed progressive spondylolisthesis with severe spinal stenosis at L4-5.  And due to her failure conservative treatment imaging findings and progression of clinical syndrome I recommended decompressive laminectomy and interbody fusion at L4-5.  I extensively went over the risks and benefits of the operation with her as well as perioperative course expectations of outcome and alternatives of surgery and she understood and agreed to proceed forward.  Operative procedure: Patient was brought into the OR was induced under general anesthesia positioned prone on the Danley Danker frame her back was prepped and draped in routine sterile fashion preoperative x-ray localized the appropriate level so after infiltration with 10 cc lidocaine with epi midline incision was made and Bovie electrocautery was used take down the subcutaneous tissue and subperiosteal dissection was carried lamina of L3-L4 and L5 exposing the cortical screw entry points at all levels.  Intraoperative x-ray confirmed identification appropriate level so the spinal laminar complex was then removed with [place]  rongeur disc facet joints were drilled down with a high-speed drill central decompression was begun complete medial facetectomies were performed under biting of the medial pedicle allowed indication of the exiting L4 nerve root this was widely decompressed aggressive under biting the superior tickling facet gave access to the lateral margin the disc base there was a herniated disc more of pseudodisc this was all cleaned out endplates were prepared sequential distraction help to select 9 mm wide 12 mm tall 15 degree implant fluoroscopy confirmed this to be appropriate size of 2 cages were packed and inserted with an extensive mount of autograft centrally.  All this was done after adequate endplate preparation.  This significantly reduced the deformity by about 50%.  Then I placed 2 cortical screws at L4 bilaterally and 2 at L5 bilaterally under fluoroscopy.  All screws had excellent purchase postop fluoroscopy confirmed good position of the implants and screws.  Wounds are copious irrigated to Kassim states was maintained the foramina reinspected to confirm patency and no migration of graft material.  We then advanced the screws assembled the heads compressed L4 against L5 and locked everything in place.  Gelfoam was ON top of the dura medium Hemovac drain was placed the fascia was injected with Exparel and the wound was closed in layers with empty Vicryl and the skin was closed with a running 4 subcuticular Dermabond benzoin Steri-Strips and sterile dressing was applied and patient recovery in stable condition.  At the end the case all needle count sponge counts were correct.

## 2021-02-23 NOTE — Anesthesia Procedure Notes (Signed)
Procedure Name: Intubation Date/Time: 02/23/2021 1:17 PM Performed by: Jenne Campus, CRNA Pre-anesthesia Checklist: Patient identified, Emergency Drugs available, Suction available and Patient being monitored Patient Re-evaluated:Patient Re-evaluated prior to induction Oxygen Delivery Method: Circle System Utilized Preoxygenation: Pre-oxygenation with 100% oxygen Induction Type: IV induction Ventilation: Mask ventilation without difficulty Laryngoscope Size: Miller and 2 Grade View: Grade I Tube type: Oral Tube size: 7.0 mm Number of attempts: 1 Airway Equipment and Method: Stylet Placement Confirmation: ETT inserted through vocal cords under direct vision,  positive ETCO2 and breath sounds checked- equal and bilateral Secured at: 21 cm Tube secured with: Tape Dental Injury: Teeth and Oropharynx as per pre-operative assessment

## 2021-02-24 DIAGNOSIS — M4316 Spondylolisthesis, lumbar region: Secondary | ICD-10-CM | POA: Diagnosis not present

## 2021-02-24 MED ORDER — CYCLOBENZAPRINE HCL 10 MG PO TABS: 10.0000 mg | ORAL_TABLET | Freq: Three times a day (TID) | ORAL | 0 refills | Status: AC | PRN

## 2021-02-24 MED ORDER — OXYCODONE-ACETAMINOPHEN 5-325 MG PO TABS: 1.0000 | ORAL_TABLET | Freq: Four times a day (QID) | ORAL | 0 refills | Status: AC | PRN

## 2021-02-24 MED ORDER — TRAMADOL HCL 50 MG PO TABS: 50.0000 mg | ORAL_TABLET | Freq: Four times a day (QID) | ORAL | 0 refills | Status: AC | PRN

## 2021-02-24 MED ORDER — TRAMADOL HCL 50 MG PO TABS
50.0000 mg | ORAL_TABLET | Freq: Four times a day (QID) | ORAL | Status: DC | PRN
  Filled 2021-02-24: qty 1

## 2021-02-24 NOTE — Plan of Care (Signed)
Patient is discharged from room 3C08 at this time. Alert and in stable condition. IV site d/c'd and instructions read to patient with understanding verbalized and all questions answered. Left unit via wheelchair with all belongings at side. 

## 2021-02-24 NOTE — Evaluation (Signed)
Physical Therapy Evaluation Patient Details Name: Stephanie Mckee MRN: [id number] DOB: [birth year] Today's Date: 02/24/2021   History of Present Illness  80 y.o. female presents to [facility] hospital on 02/23/2021 with history of longstanding back pain radiating to BLE. Imaging reveals severe spinal stenosis at L4-5. PT underwent decompressive laminectomy with medial facectomies and PLIF at L4-5 on 02/23/2021. PMH includes osteoporosis, heart murmur, thryoid nodule, cataract extraction.  Clinical Impression  Pt presents to PT s/p L4-5 PLIF. Pt is mobilizing well, with one instance of lateral drift when ambulating, but otherwise not requiring assistance/supervision for any mobility required in the home setting. Pt is able to perform bed mobility, transfers, and negotiate stairs, all while maintaining back precautions. Pt is able to independently manage brace. Pt has no further acute PT needs at this time. Pt is encouraged to ambulate multiple times a day for the remainder of this admission. PT recommends no PT or DME. Acute PT signing off.    Follow Up Recommendations No PT follow up    Equipment Recommendations  None recommended by PT    Recommendations for Other Services       Precautions / Restrictions Precautions Precautions: Back Precaution Booklet Issued: Yes (comment) Required Braces or Orthoses: Spinal Brace Spinal Brace: Lumbar corset;Applied in sitting position Restrictions Weight Bearing Restrictions: No      Mobility  Bed Mobility Overal bed mobility: Modified Independent             General bed mobility comments: increased time, rolling and sidelying to/from sitting    Transfers Overall transfer level: Independent   Transfers: Sit to/from Stand Sit to Stand: Independent            Ambulation/Gait Ambulation/Gait assistance: Supervision Gait Distance (Feet): 400 Feet Assistive device: None Gait Pattern/deviations: Step-through pattern Gait velocity:  functional Gait velocity interpretation: 1.31 - 2.62 ft/sec, indicative of limited community ambulator General Gait Details: pt with one instance of rightward drift, bumping into railing, otherwise WFL  Stairs Stairs: Yes Stairs assistance: Modified independent (Device/Increase time) Stair Management: One rail Right;Step to pattern Number of Stairs: 1    Wheelchair Mobility    Modified Rankin (Stroke Patients Only)       Balance Overall balance assessment: Mild deficits observed, not formally tested                                           Pertinent Vitals/Pain Pain Assessment: No/denies pain    Home Living Family/patient expects to be discharged to:: Private residence Living Arrangements: Spouse/significant other Available Help at Discharge: Family;Available 24 hours/day Type of Home: House Home Access: Stairs to enter Entrance Stairs-Rails: Right Entrance Stairs-Number of Steps: 1 Home Layout: One level Home Equipment: Walker - 4 wheels;Shower seat - built in      Prior Function Level of Independence: Independent               Journalist, newspaper        Extremity/Trunk Assessment   Upper Extremity Assessment Upper Extremity Assessment: Overall WFL for tasks assessed    Lower Extremity Assessment Lower Extremity Assessment: Overall WFL for tasks assessed    Cervical / Trunk Assessment Cervical / Trunk Assessment: Other exceptions Cervical / Trunk Exceptions: s/p spinal surgery, LSO  Communication   Communication: No difficulties  Cognition Arousal/Alertness: Awake/alert Behavior During Therapy: WFL for tasks assessed/performed Overall Cognitive Status: Within Functional Limits  for tasks assessed                                        General Comments General comments (skin integrity, edema, etc.): VSS on RA    Exercises     Assessment/Plan    PT Assessment Patent does not need any further PT services  PT  Problem List         PT Treatment Interventions      PT Goals (Current goals can be found in the Care Plan section)       Frequency     Barriers to discharge        Co-evaluation               AM-PAC PT "6 Clicks" Mobility  Outcome Measure Help needed turning from your back to your side while in a flat bed without using bedrails?: None Help needed moving from lying on your back to sitting on the side of a flat bed without using bedrails?: None Help needed moving to and from a bed to a chair (including a wheelchair)?: None Help needed standing up from a chair using your arms (e.g., wheelchair or bedside chair)?: None Help needed to walk in hospital room?: A Little Help needed climbing 3-5 steps with a railing? : None 6 Click Score: 23    End of Session Equipment Utilized During Treatment: Back brace Activity Tolerance: Patient tolerated treatment well Patient left: in chair;with call bell/phone within reach Nurse Communication: Mobility status      Time: 0801-0819 PT Time Calculation (min) (ACUTE ONLY): 18 min   Charges:   PT Evaluation $PT Eval Low Complexity: [place], PT, DPT Acute Rehabilitation Pager: [phone]   Zenaida Niece 02/24/2021, 8:56 AM

## 2021-02-24 NOTE — Progress Notes (Signed)
Subjective: Patient reports low back pain that is well controlled on PO analgesics. She has no other complaints. No acute events overnight.  Objective: Vital signs in last 24 hours: Temp:  [97 F (36.1 C)-98.1 F (36.7 C)] 98.1 F (36.7 C) (04/09 0712) Pulse Rate:  [71-79] 71 (04/09 0712) Resp:  [12-18] 16 (04/09 0712) BP: (98-143)/(59-81) 113/71 (04/09 0712) SpO2:  [97 %-100 %] 99 % (04/09 0712) Weight:  [66.2 kg] 66.2 kg (04/08 1041)  Intake/Output from previous day: 04/08 0701 - 04/09 0700 In: 1840 [P.O.:240; I.V.:1200; IV Piggyback:400] Out: 710 [Urine:200; Drains:210; Blood:300] Intake/Output this shift: Total I/O In: -  Out: 10 [Drains:10]  Physical Exam: Patient is awake, A/O X 4, conversant, and in good spirits. Speech is fluent and appropriate. Doing well. MAEW with good strength that is symmetric bilaterally. Dressing is clean dry intact. Incision is well approximated with no drainage, erythema, or edema.  Lab Results: Recent Labs    02/21/21 0929  WBC 4.0  HGB 13.5  HCT 39.2  PLT 155   BMET No results for input(s): NA, K, CL, CO2, GLUCOSE, BUN, CREATININE, CALCIUM in the last 72 hours.  Studies/Results: DG Lumbar Spine 2-3 Views  Result Date: 02/23/2021 CLINICAL DATA:  PLIF L4-L5. EXAM: DG C-ARM 1-60 MIN; LUMBAR SPINE - 2-3 VIEW FLUOROSCOPY TIME:  Fluoroscopy Time:  45 seconds. Radiation Exposure Index (if provided by the fluoroscopic device): 31.5 mGy. Number of Acquired Spot Images: 5 COMPARISON:  None. FINDINGS: Five C-arm fluoroscopic images were obtained intraoperatively and submitted for post operative interpretation. These images demonstrate bilateral pedicle screws at L4 and L5 with intervening L4-L5 spacer. Grade 2 anterolisthesis of L4 on L5. Please see the performing provider's procedural report for further detail. IMPRESSION: Intraoperative fluoroscopy, as detailed above. Electronically Signed   By: Margaretha Sheffield MD   On: 02/23/2021 16:24   DG  C-Arm 1-60 Min  Result Date: 02/23/2021 CLINICAL DATA:  PLIF L4-L5. EXAM: DG C-ARM 1-60 MIN; LUMBAR SPINE - 2-3 VIEW FLUOROSCOPY TIME:  Fluoroscopy Time:  45 seconds. Radiation Exposure Index (if provided by the fluoroscopic device): 31.5 mGy. Number of Acquired Spot Images: 5 COMPARISON:  None. FINDINGS: Five C-arm fluoroscopic images were obtained intraoperatively and submitted for post operative interpretation. These images demonstrate bilateral pedicle screws at L4 and L5 with intervening L4-L5 spacer. Grade 2 anterolisthesis of L4 on L5. Please see the performing provider's procedural report for further detail. IMPRESSION: Intraoperative fluoroscopy, as detailed above. Electronically Signed   By: Margaretha Sheffield MD   On: 02/23/2021 16:24    Assessment/Plan: Patient is post-op day 1 s/p L4/5 decompression and fusion. She is recovering well and reports a significant reduction of her preoperative symptoms.  Her only complaint is moderate incisional discomfort.  She has ambulated well with nursing staff.  She is awaiting PT/OT evaluation.  Continue LSO brace when OOB. Continue working on pain control, mobility and ambulating patient. Will plan for discharge today.   LOS: 0 days     Marvis Moeller, DNP, NP-C 02/24/2021, 8:27 AM

## 2021-02-24 NOTE — Anesthesia Postprocedure Evaluation (Signed)
Anesthesia Post Note  Patient: Stephanie Mckee  Procedure(s) Performed: Posterior Lumbar Interbody Fusion - Lumbar Four- Lumbar Five (N/A Back)     Patient location during evaluation: PACU Anesthesia Type: General Level of consciousness: awake and alert Pain management: pain level controlled Vital Signs Assessment: post-procedure vital signs reviewed and stable Respiratory status: spontaneous breathing, nonlabored ventilation, respiratory function stable and patient connected to nasal cannula oxygen Cardiovascular status: blood pressure returned to baseline and stable Postop Assessment: no apparent nausea or vomiting Anesthetic complications: no   No complications documented.  Last Vitals:  Vitals:   02/24/21 0519 02/24/21 0712  BP: 110/60 113/71  Pulse: 74 71  Resp: 18 16  Temp: (!) 36.4 C 36.7 C  SpO2: 98% 99%    Last Pain:  Vitals:   02/24/21 0750  TempSrc:   PainSc: 0-No pain                 Napolean Sia

## 2021-02-24 NOTE — Discharge Instructions (Signed)
Wound Care  Keep the incision clean and dry remove the outer dressing in 2 -3 days Do not put any creams, lotions, or ointments on incision. Leave steri-strips on back.  They will fall off by themselves.  Activity Walk each and every day, increasing distance each day. No lifting greater than 5 lbs.  No lifting no bending no twisting no driving or riding a car unless coming back and forth to see me. If provided with back brace, wear when out of bed.  It is not necessary to wear brace in bed. Diet Resume your normal diet.   Return to Work Will be discussed at you follow up appointment.  Call Your Doctor If Any of These Occur Redness, drainage, or swelling at the wound.  Temperature greater than 101 degrees. Severe pain not relieved by pain medication. Incision starts to come apart. Follow Up Appt Call today for appointment in 1-2 weeks ([phone]) or for problems.  If you have any hardware placed in your spine, you will need an x-ray before your appointment.

## 2021-02-24 NOTE — Discharge Summary (Signed)
Physician Discharge Summary  Patient ID: Sharna Gabrys MRN: [id number] DOB/AGE: February 20, 1941 80 y.o.  Admit date: 02/23/2021 Discharge date: 02/24/2021  Admission Diagnoses:Grade 1/2 spondylolisthesis L4-5 with severe spinal stenosis bilateral L4-L5 radiculopathies and instability  Discharge Diagnoses: Grade 1/2 spondylolisthesis L4-5 with severe spinal stenosis bilateral L4-L5 radiculopathies and instability Active Problems:   Spondylolisthesis at L4-L5 level   Discharged Condition: good  Hospital Course: The patient was admitted on 02/24/2021 and taken to the operating room where the patient underwent decompression and fusion. The patient tolerated the procedure well and was taken to the recovery room and then to the floor in stable condition. The hospital course was routine. There were no complications. The wound remained clean dry and intact. Pt had appropriate back soreness. No complaints of leg pain or new N/T/W. The patient remained afebrile with stable vital signs, and tolerated a regular diet. The patient continued to increase activities, and pain was well controlled with oral pain medications.  Consults: None  Significant Diagnostic Studies: radiology: x-ray  Treatments: surgery:  1. decompressive lumbar laminectomy with complete medial facetectomies were added foraminotomies the L4 and L5 nerve roots in excess and requiring more work than would be needed with a standard interbody fusion  2.  Posterior lumbar interbody fusion L4-5 utilizing the globus insert and rotate titanium cages packed with locally harvested autograft mixed with vivigen and Proteus  3.  Cortical screw fixation utilizing the globus Creo amp modular cortical screw set with threaded nuts L4-5  Discharge Exam: Blood pressure 113/71, pulse 71, temperature 98.1 F (36.7 C), temperature source Oral, resp. rate 16, height 5\' 6"  (1.676 m), weight 66.2 kg, SpO2 99 %.  Physical Exam: Patient is awake, A/O X  4, conversant, and in good spirits. Speech is fluent and appropriate. Doing well. MAEW with good strength that is symmetric bilaterally. Drain has been removed. Dressing is clean dry intact. Incision is well approximated with no drainage, erythema, or edema.  Disposition: Discharge disposition: 01-Home or Self Care        Allergies as of 02/24/2021      Reactions   Actonel [risedronate Sodium]    Decreased bone density    Hydromorphone Nausea And Vomiting   Prochlorperazine Other (See Comments)   Muscle seizure (drooling, tongue turns sideways; "weird things")      Medication List    TAKE these medications   celecoxib 200 MG capsule Commonly known as: CELEBREX Take 200 mg by mouth 2 (two) times daily.   cetirizine 10 MG tablet Commonly known as: ZYRTEC Take 10 mg by mouth daily.   cyclobenzaprine 10 MG tablet Commonly known as: FLEXERIL Take 10 mg by mouth at bedtime as needed for muscle spasms. What changed: Another medication with the same name was added. Make sure you understand how and when to take each.   cyclobenzaprine 10 MG tablet Commonly known as: FLEXERIL Take 1 tablet (10 mg total) by mouth 3 (three) times daily as needed for muscle spasms. What changed: You were already taking a medication with the same name, and this prescription was added. Make sure you understand how and when to take each.   gabapentin 300 MG capsule Commonly known as: NEURONTIN Take 300 mg by mouth at bedtime.   GLUCOSAMINE-CHONDROITIN PO Take 1 tablet by mouth daily.   HAIR/SKIN/NAILS PO Take 1 tablet by mouth daily.   Krill Oil 500 MG Caps Take 500 mg by mouth daily.   multivitamin with minerals Tabs tablet Take 1 tablet by mouth  daily.   oxyCODONE-acetaminophen 5-325 MG tablet Commonly known as: Percocet Take 1 tablet by mouth every 6 (six) hours as needed for severe pain.   Potassium 99 MG Tabs Take 99 mg by mouth daily.   PRESERVISION AREDS 2 PO Take 1 capsule by  mouth in the morning and at bedtime.   PREVAGEN PO Take 1 capsule by mouth daily.   traMADol 50 MG tablet Commonly known as: ULTRAM Take 50-100 mg by mouth at bedtime as needed for moderate pain. What changed: Another medication with the same name was added. Make sure you understand how and when to take each.   traMADol 50 MG tablet Commonly known as: Ultram Take 1 tablet (50 mg total) by mouth every 6 (six) hours as needed for moderate pain. What changed: You were already taking a medication with the same name, and this prescription was added. Make sure you understand how and when to take each.   TURMERIC PO Take 2 capsules by mouth daily.   Vitamin B-12 5000 MCG Tbdp Take 5,000 mcg by mouth daily.   Vitamin D 50 MCG (2000 UT) Caps Take 2,000 Units by mouth daily.        Signed: Marvis Moeller, DNP, NP-C 02/24/2021, 8:35 AM

## 2021-04-03 DIAGNOSIS — M4316 Spondylolisthesis, lumbar region: Secondary | ICD-10-CM | POA: Diagnosis not present

## 2021-04-05 DIAGNOSIS — M25511 Pain in right shoulder: Secondary | ICD-10-CM | POA: Diagnosis not present

## 2021-04-10 ENCOUNTER — Ambulatory Visit: Payer: PPO | Admitting: Family Medicine

## 2021-04-10 ENCOUNTER — Ambulatory Visit: Payer: Self-pay

## 2021-04-10 ENCOUNTER — Other Ambulatory Visit: Payer: Self-pay

## 2021-04-10 ENCOUNTER — Encounter: Payer: Self-pay | Admitting: Family Medicine

## 2021-04-10 DIAGNOSIS — M25512 Pain in left shoulder: Secondary | ICD-10-CM

## 2021-04-10 DIAGNOSIS — M25511 Pain in right shoulder: Secondary | ICD-10-CM

## 2021-04-10 DIAGNOSIS — G8929 Other chronic pain: Secondary | ICD-10-CM | POA: Diagnosis not present

## 2021-04-10 NOTE — Progress Notes (Signed)
Office Visit Note   Patient: Stephanie Mckee           Date of Birth: 15-Sep-1941           MRN: [id number] Visit Date: 04/10/2021 Requested by: Greig Right, MD [address] [place],  [place] [address] PCP: Greig Right, MD  Subjective: Chief Complaint  Patient presents with  . Left Shoulder - Pain    Has had a little bit of pain in the left shoulder for a couple years, but the pain has worsened since she had back surgery 5 weeks ago. She has had to push herself up with her arms more. Decreased ROM in the left shoulder.  . Right Shoulder - Pain    Chronic pain in the right shoulder. Saw Dr. Marlou Sa for this in 2019. Has better ROM in this shoulder than the left currently. Right-hand dominant.    HPI: She is here with left greater than right shoulder pain.  She has had pain in both shoulders for quite a while.  She has seen Dr. Marlou Sa in 2019 and had x-rays of the right shoulder which revealed moderate glenohumeral arthritis.  About a month ago she had lumbar fusion.  After the surgery she had to rely on her arms quite a bit more than usual and it really seem to aggravate her chronic shoulder pain.  Now after trying to avoid using her arms, she has developed stiffness especially on the left.                ROS:   All other systems were reviewed and are negative.  Objective: Vital Signs: There were no vitals taken for this visit.  Physical Exam:  General:  Alert and oriented, in no acute distress. Pulm:  Breathing unlabored. Psy:  Normal mood, congruent affect.  Shoulders: She has very limited passive and active range of motion on the left, abduction of about 75 degrees and external rotation about 40 degrees.  Isometric rotator cuff strength is essentially 5/5 throughout.  She does have some pain with empty can test on the left.  Right shoulder range of motion is better but she does have some limitations with external rotation.  Imaging: US Guided Needle  Placement  Result Date: 04/10/2021 Ultrasound guided injection is preferred based studies that show increased duration, increased effect, greater accuracy, decreased procedural pain, increased response rate, and decreased cost with ultrasound guided versus blind injection.   Verbal informed consent obtained.  Time-out conducted.  Noted no overlying erythema, induration, or other signs of local infection. Ultrasound-guided left glenohumeral injection: After sterile prep with Betadine, injected 4 cc 0.25% bupivocaine without epinephrine and 6 mg betamethasone using a 22-gauge spinal needle, passing the needle from posterior approach into the glenohumeral joint.  Injectate seen filling the joint capsule.    XR Shoulder Left  Result Date: 04/10/2021 X-rays of left shoulder reveal severe bone-on-bone glenohumeral DJD and moderate to severe AC joint arthropathy.  XR Shoulder Right  Result Date: 04/10/2021 X-rays of the right shoulder reveal moderate to severe glenohumeral DJD and AC joint arthropathy.  No sign of neoplasm.   Assessment & Plan: 1.  Left greater than right shoulder pain, suspect mainly due to glenohumeral DJD.  Cannot rule out partial rotator cuff tear. -We discussed options, her right shoulder is more tolerable but she would like to proceed with left shoulder glenohumeral injection today.  If this helps a lot, we can do the same on the right.  If she gets no relief from injection, then we will investigate further for rotator cuff tear.     Procedures: No procedures performed        PMFS History: Patient Active Problem List   Diagnosis Date Noted  . Spondylolisthesis at L4-L5 level 02/23/2021  . Transient global amnesia 10/29/2019  . Transient alteration of awareness 10/29/2019   Past Medical History:  Diagnosis Date  . Heart murmur   . Osteoporosis   . Thoracic degenerative disc disease   . Thyroid nodule     Family History  Problem Relation Age of Onset  . Breast  cancer Maternal Aunt   . Breast cancer Daughter   . Dementia Mother   . Renal cancer Father   . Bone cancer Father   . Multiple myeloma Father   . Healthy Sister     Past Surgical History:  Procedure Laterality Date  . CATARACT EXTRACTION Right   . thumb surgery     Social History   Occupational History  . Not on file  Tobacco Use  . Smoking status: Never Smoker  . Smokeless tobacco: Never Used  Vaping Use  . Vaping Use: Never used  Substance and Sexual Activity  . Alcohol use: Yes    Comment: 3-4 drinks per week (wine)  . Drug use: Never  . Sexual activity: Not on file

## 2021-05-15 DIAGNOSIS — M4316 Spondylolisthesis, lumbar region: Secondary | ICD-10-CM | POA: Diagnosis not present

## 2021-07-02 DIAGNOSIS — M792 Neuralgia and neuritis, unspecified: Secondary | ICD-10-CM | POA: Diagnosis not present

## 2021-07-03 DIAGNOSIS — H25812 Combined forms of age-related cataract, left eye: Secondary | ICD-10-CM | POA: Diagnosis not present

## 2021-07-03 DIAGNOSIS — H353131 Nonexudative age-related macular degeneration, bilateral, early dry stage: Secondary | ICD-10-CM | POA: Diagnosis not present

## 2021-07-03 DIAGNOSIS — Z01818 Encounter for other preprocedural examination: Secondary | ICD-10-CM | POA: Diagnosis not present

## 2021-07-24 DIAGNOSIS — M19012 Primary osteoarthritis, left shoulder: Secondary | ICD-10-CM | POA: Diagnosis not present

## 2021-07-24 DIAGNOSIS — M19011 Primary osteoarthritis, right shoulder: Secondary | ICD-10-CM | POA: Diagnosis not present

## 2021-07-24 DIAGNOSIS — M25512 Pain in left shoulder: Secondary | ICD-10-CM | POA: Diagnosis not present

## 2021-07-25 DIAGNOSIS — H25812 Combined forms of age-related cataract, left eye: Secondary | ICD-10-CM | POA: Diagnosis not present

## 2021-07-25 DIAGNOSIS — H2512 Age-related nuclear cataract, left eye: Secondary | ICD-10-CM | POA: Diagnosis not present

## 2021-07-25 DIAGNOSIS — H52202 Unspecified astigmatism, left eye: Secondary | ICD-10-CM | POA: Diagnosis not present

## 2021-08-16 DIAGNOSIS — M4316 Spondylolisthesis, lumbar region: Secondary | ICD-10-CM | POA: Diagnosis not present

## 2021-08-20 DIAGNOSIS — M19011 Primary osteoarthritis, right shoulder: Secondary | ICD-10-CM | POA: Diagnosis not present

## 2021-08-20 DIAGNOSIS — Z885 Allergy status to narcotic agent status: Secondary | ICD-10-CM | POA: Diagnosis not present

## 2021-08-20 DIAGNOSIS — M19012 Primary osteoarthritis, left shoulder: Secondary | ICD-10-CM | POA: Diagnosis not present

## 2021-08-20 DIAGNOSIS — Z888 Allergy status to other drugs, medicaments and biological substances status: Secondary | ICD-10-CM | POA: Diagnosis not present

## 2021-08-23 ENCOUNTER — Other Ambulatory Visit: Payer: Self-pay | Admitting: Orthopedic Surgery

## 2021-08-23 DIAGNOSIS — M19012 Primary osteoarthritis, left shoulder: Secondary | ICD-10-CM

## 2021-09-11 ENCOUNTER — Ambulatory Visit
Admission: RE | Admit: 2021-09-11 | Discharge: 2021-09-11 | Disposition: A | Discharge status: Home / Self Care | Payer: PPO | Source: Ambulatory Visit | Attending: Orthopedic Surgery | Admitting: Orthopedic Surgery

## 2021-09-11 ENCOUNTER — Other Ambulatory Visit: Payer: Self-pay

## 2021-09-11 DIAGNOSIS — M19012 Primary osteoarthritis, left shoulder: Secondary | ICD-10-CM | POA: Diagnosis not present

## 2021-09-11 DIAGNOSIS — M7582 Other shoulder lesions, left shoulder: Secondary | ICD-10-CM | POA: Diagnosis not present

## 2021-09-11 DIAGNOSIS — M75112 Incomplete rotator cuff tear or rupture of left shoulder, not specified as traumatic: Secondary | ICD-10-CM | POA: Diagnosis not present

## 2021-09-11 IMAGING — MR MR SHOULDER*L* W/O CM
5 series · 40 of 40 positions shown · non-contrast
Comparison: X-ray shoulder [DATE].

CLINICAL DATA: Patient complains of generalized left shoulder pain
for 2 years. Patient denies history of surgery or injury.

EXAM:
MRI OF THE LEFT SHOULDER WITHOUT CONTRAST
TECHNIQUE: Multiplanar, multisequence MR imaging of the shoulder was performed.
No intravenous contrast was administered.

[Series 3: T2 fat-sat · axial · 4.0mm · 0.27mm/px · z∈[-95,+46]mm · 10 of 30 slices shown (1 of 3)]
[im 1/30]
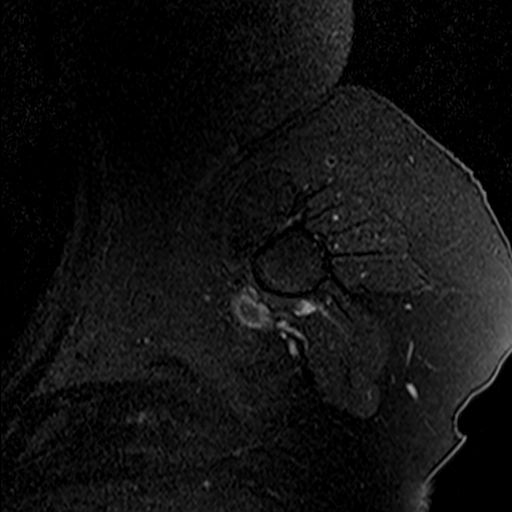
[im 4/30]
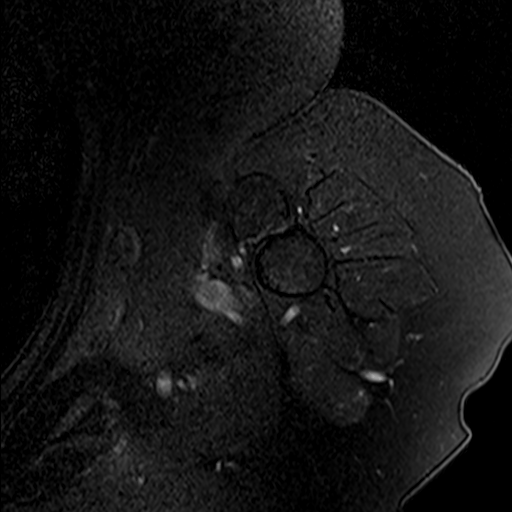
[im 7/30]
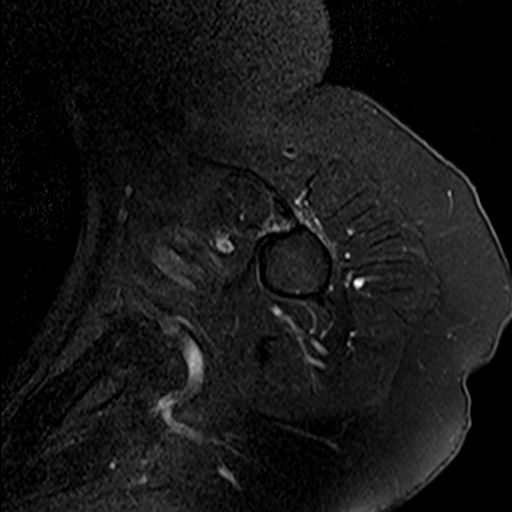
[im 10/30]
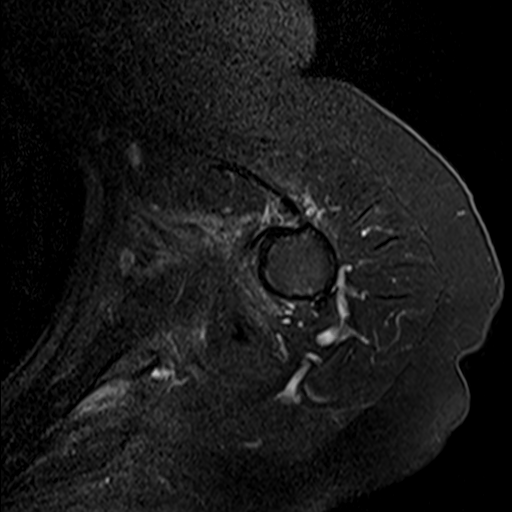
[im 13/30]
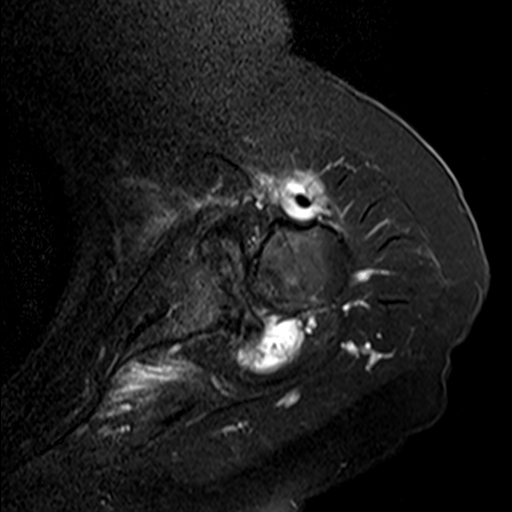
[im 17/30]
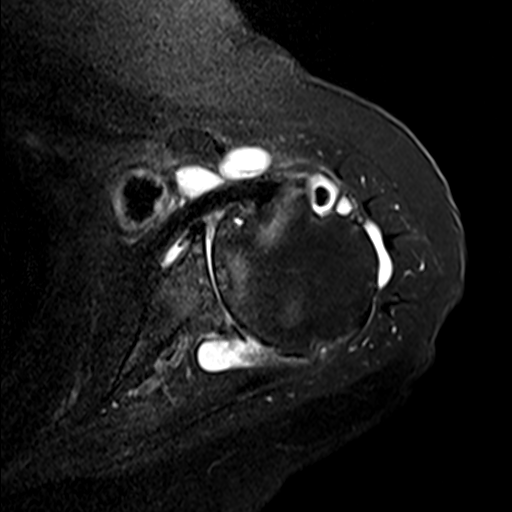
[im 20/30]
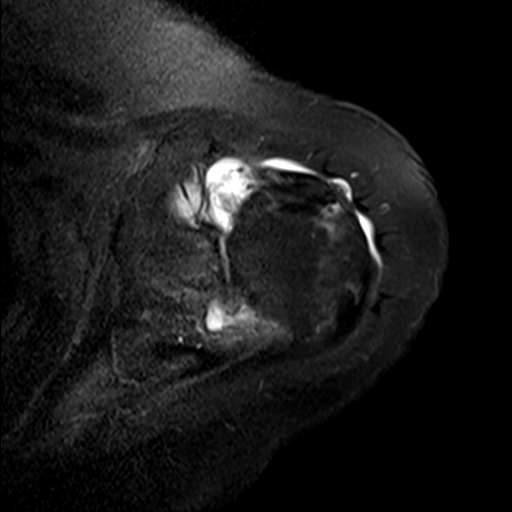
[im 23/30]
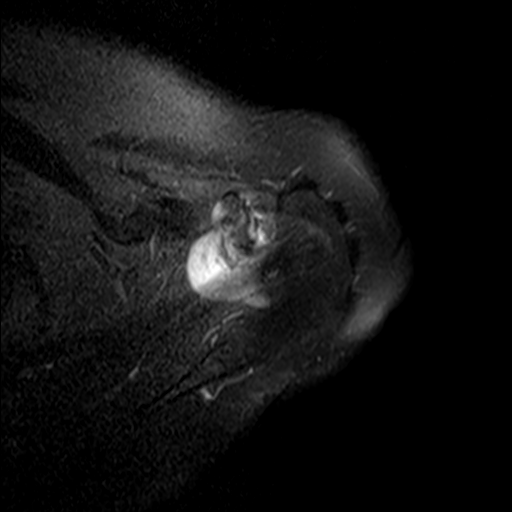
[im 26/30]
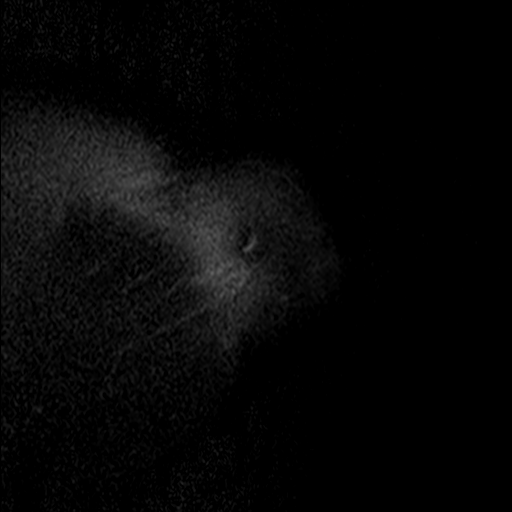
[im 30/30]
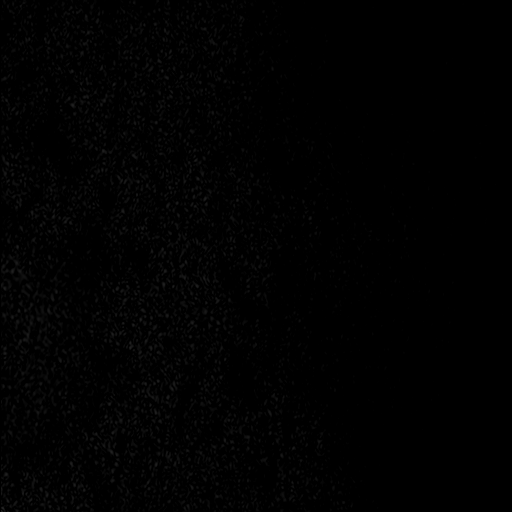

[Series 4: PD · oblique · 4.0mm · 0.59mm/px · 7 of 18 slices shown]
[im 1/18]
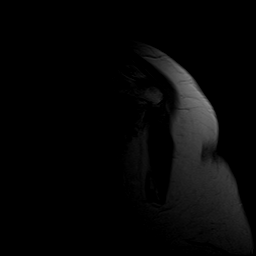
[im 3/18]
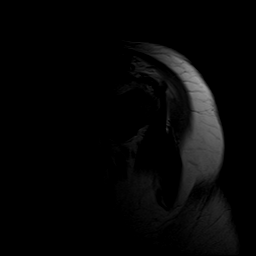
[im 6/18]
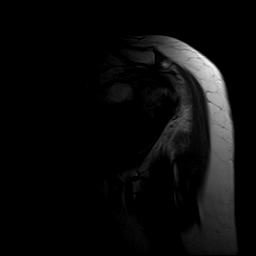
[im 9/18]
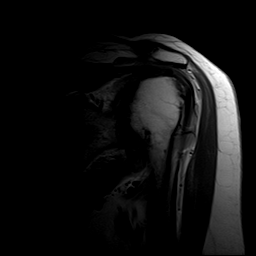
[im 12/18]
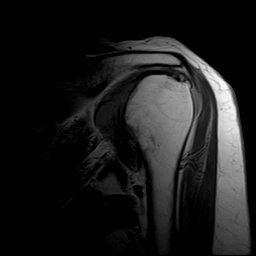
[im 15/18]
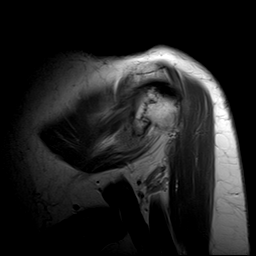
[im 18/18]
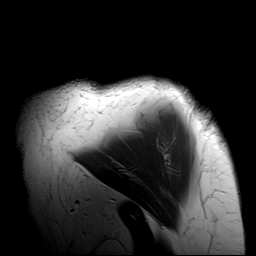

[Series 6: T2 fat-sat · oblique · 4.0mm · 0.59mm/px · 7 of 18 slices shown (2 of 3)]
[im 1/18]
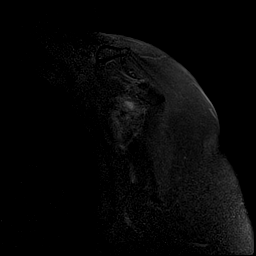
[im 3/18]
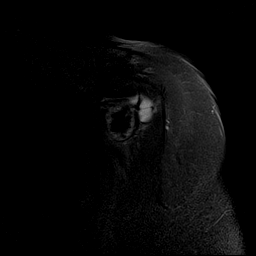
[im 6/18]
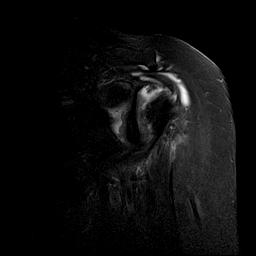
[im 9/18]
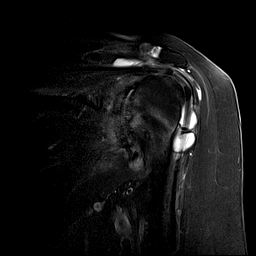
[im 12/18]
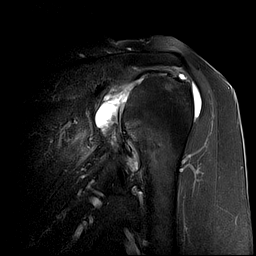
[im 15/18]
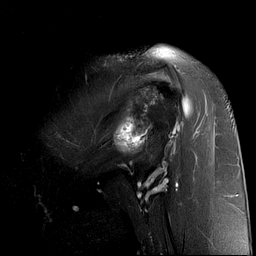
[im 18/18]
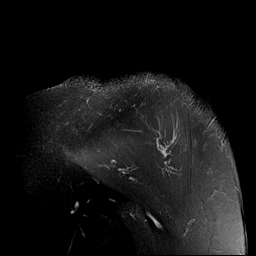

[Series 7: T2 fat-sat · sagittal · 4.0mm · 0.59mm/px · 8 of 20 slices shown (3 of 3)]
[im 1/20]
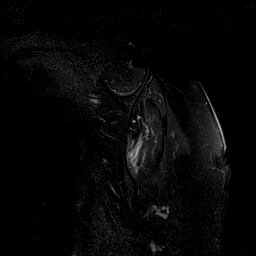
[im 3/20]
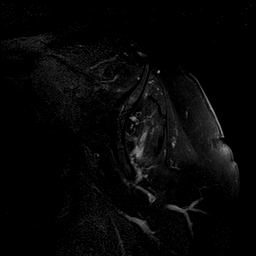
[im 6/20]
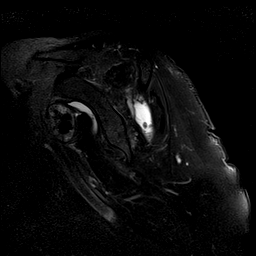
[im 9/20]
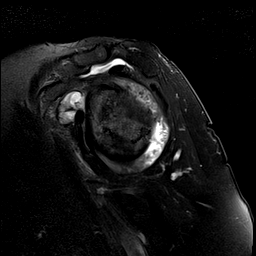
[im 11/20]
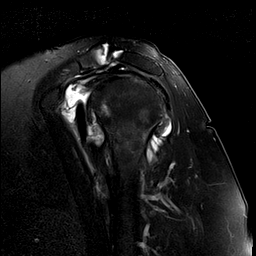
[im 14/20]
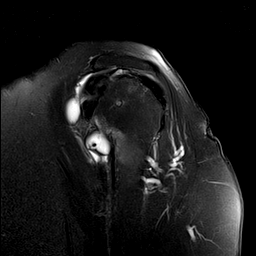
[im 17/20]
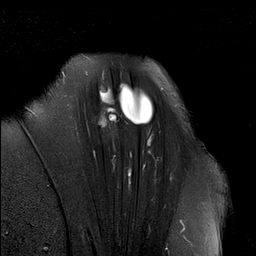
[im 20/20]
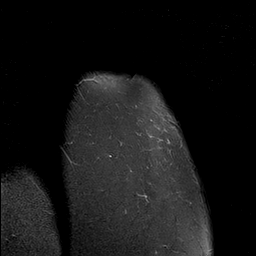

[Series 8: T1 · sagittal · 4.0mm · 0.59mm/px · 8 of 20 slices shown]
[im 1/20]
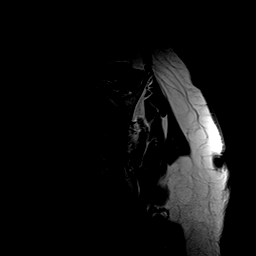
[im 3/20]
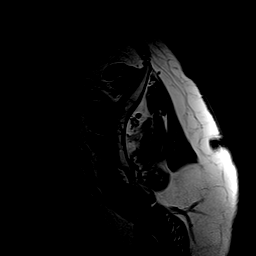
[im 6/20]
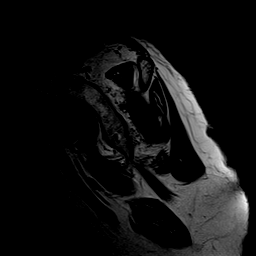
[im 9/20]
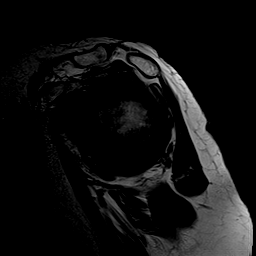
[im 11/20]
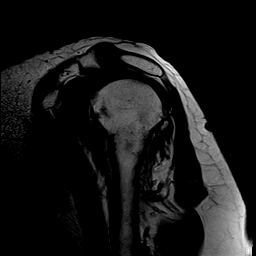
[im 14/20]
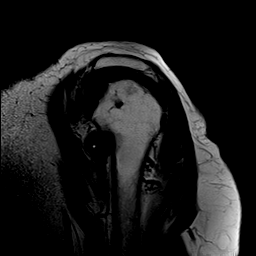
[im 17/20]
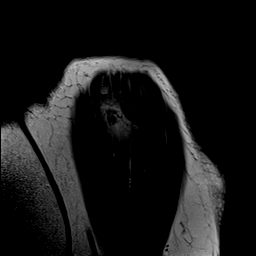
[im 20/20]
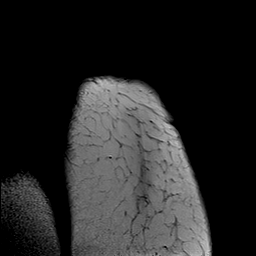

[40 of 40 positions shown; findings below may reference images not displayed]

FINDINGS: Rotator cuff: Moderate tendinosis of the supraspinatus tendon with a
high-grade partial-thickness articular surface tear. Infraspinatus
tendon is intact. Teres minor tendon is intact. Subscapularis tendon
is intact.

Muscles: No atrophy or abnormal signal of the muscles of the rotator
cuff.

Biceps long head: Mild tendinosis of the intra-articular portion of
the long head of the biceps tendon.

Acromioclavicular Joint: Mild arthropathy of the acromioclavicular
joint. Type II acromion. Trace subacromial/subdeltoid bursal fluid.

Glenohumeral Joint: Large joint effusion with synovitis. Extensive
full-thickness cartilage loss of the glenohumeral joint with
subchondral reactive marrow edema and marginal osteophytes.

Labrum:  Diffuse labral degeneration.

Bones: No acute fracture or dislocation. No aggressive osseous
lesion.

Other: None.
IMPRESSION: 1. Severe advanced osteoarthritis of the left glenohumeral joint.
2. Moderate tendinosis of the supraspinatus tendon with a high-grade
partial-thickness articular surface tear.

## 2021-09-12 ENCOUNTER — Other Ambulatory Visit: Payer: Self-pay | Admitting: Family Medicine

## 2021-09-12 DIAGNOSIS — Z1231 Encounter for screening mammogram for malignant neoplasm of breast: Secondary | ICD-10-CM

## 2021-09-24 DIAGNOSIS — M19012 Primary osteoarthritis, left shoulder: Secondary | ICD-10-CM | POA: Diagnosis not present

## 2021-09-24 DIAGNOSIS — M19011 Primary osteoarthritis, right shoulder: Secondary | ICD-10-CM | POA: Diagnosis not present

## 2021-10-13 DIAGNOSIS — J0100 Acute maxillary sinusitis, unspecified: Secondary | ICD-10-CM | POA: Diagnosis not present

## 2021-10-16 ENCOUNTER — Other Ambulatory Visit: Payer: Self-pay

## 2021-10-16 ENCOUNTER — Ambulatory Visit
Admission: RE | Admit: 2021-10-16 | Discharge: 2021-10-16 | Disposition: A | Discharge status: Home / Self Care | Payer: PPO | Source: Ambulatory Visit | Attending: Family Medicine | Admitting: Family Medicine

## 2021-10-16 DIAGNOSIS — Z1231 Encounter for screening mammogram for malignant neoplasm of breast: Secondary | ICD-10-CM

## 2021-10-16 IMAGING — MG MM DIGITAL SCREENING BILAT W/ TOMO AND CAD
8 series · 8 of 24 positions shown · non-contrast
Comparison: Previous exam(s).

CLINICAL DATA: Screening.

EXAM:
DIGITAL SCREENING BILATERAL MAMMOGRAM WITH TOMOSYNTHESIS AND CAD
TECHNIQUE: Bilateral screening digital craniocaudal and mediolateral oblique
mammograms were obtained. Bilateral screening digital breast
tomosynthesis was performed. The images were evaluated with
computer-aided detection.

[R MLO synth-2D]
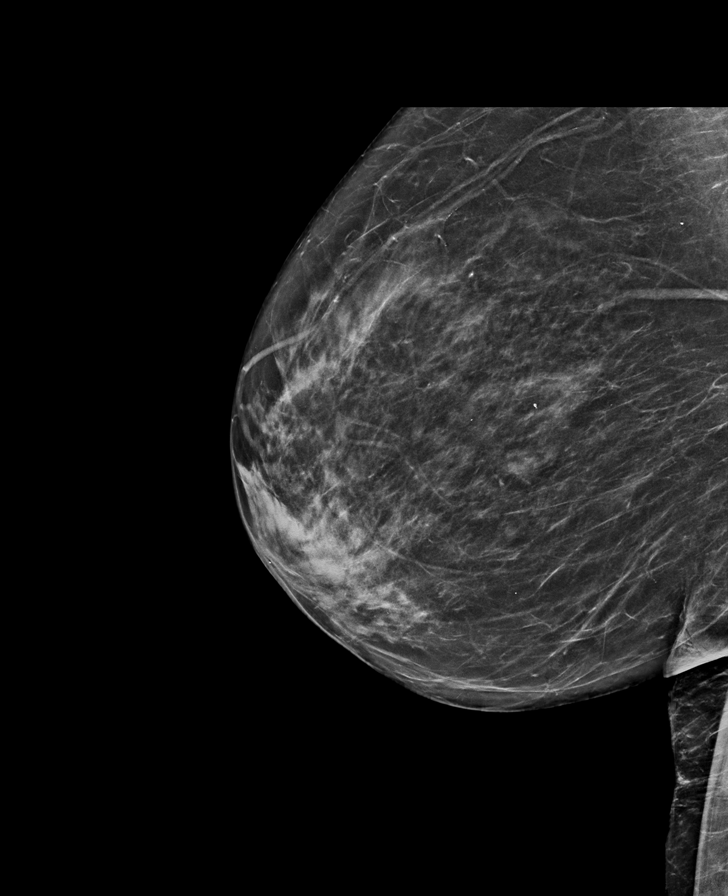

[R CC synth-2D]
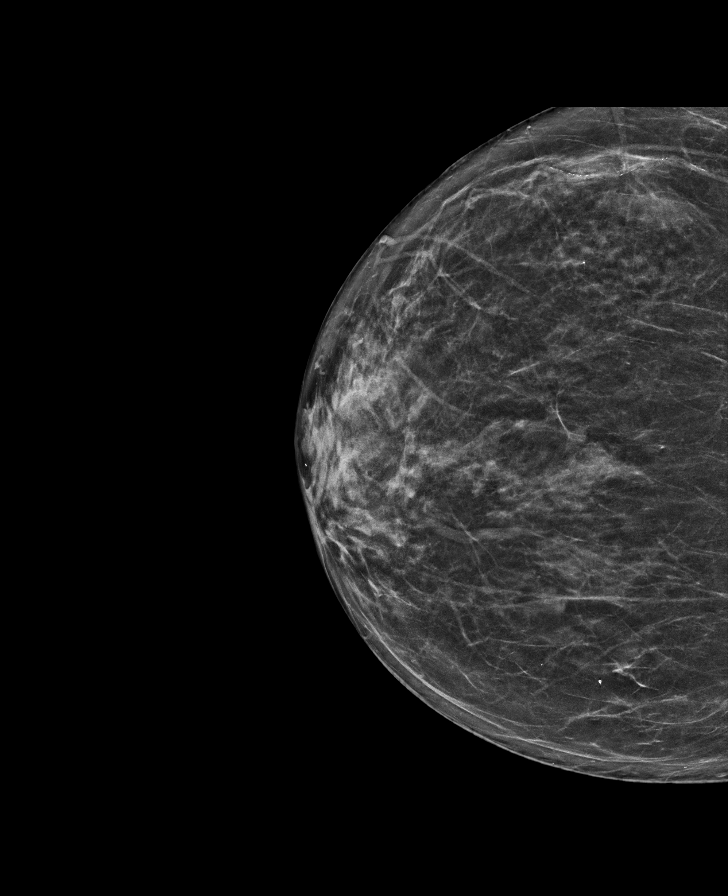

[L CC synth-2D]
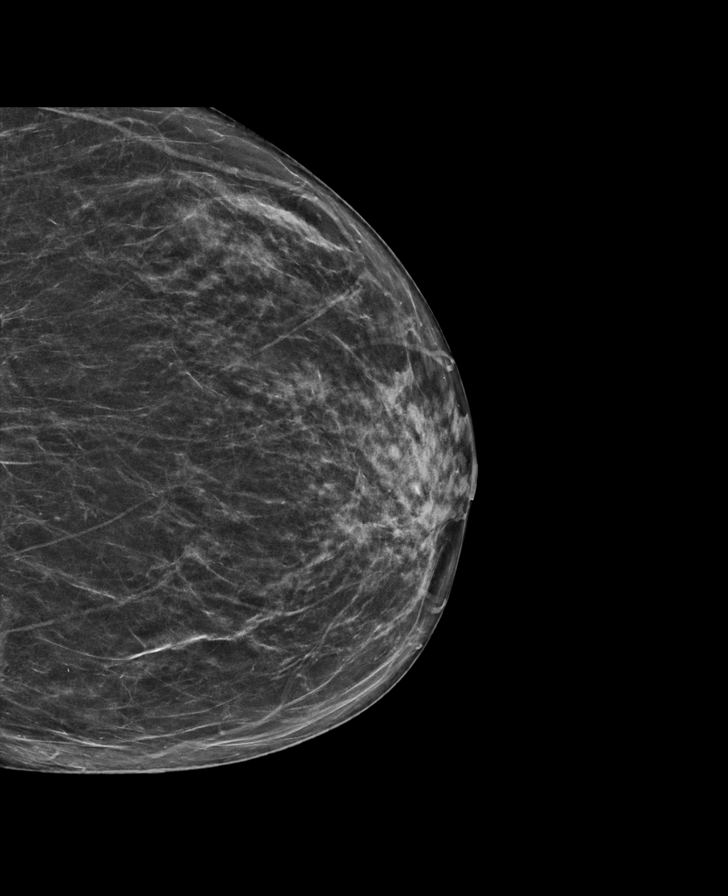

[L MLO synth-2D]
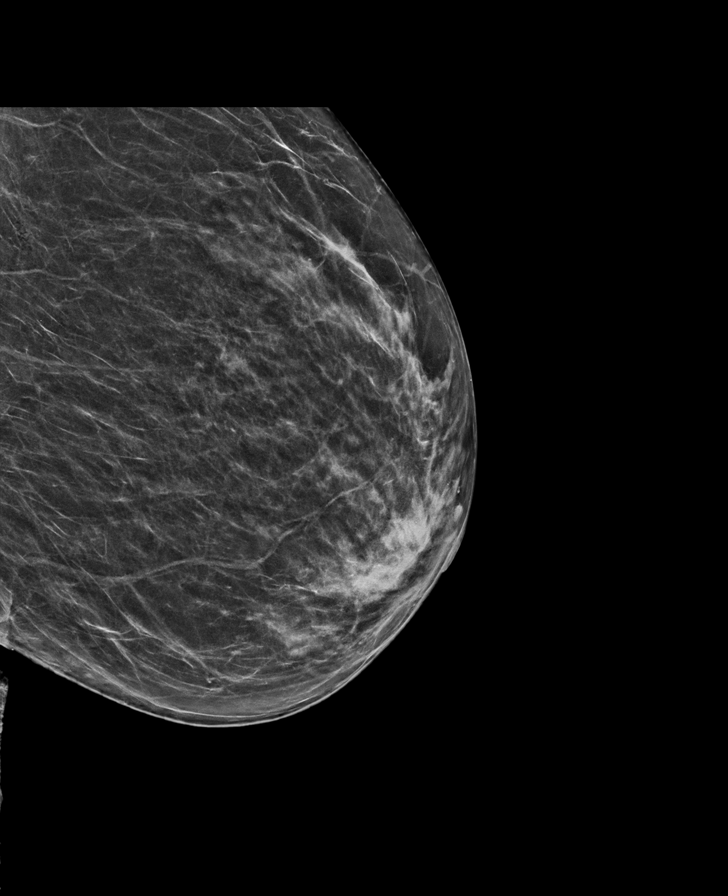

[R MLO tomo · tomo slice 36/71.0]
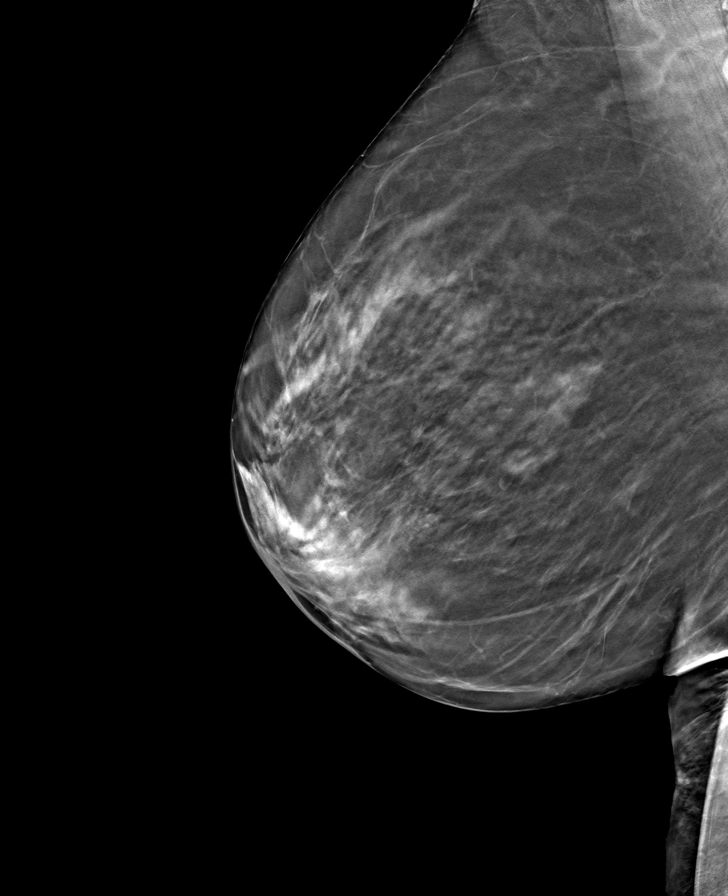

[L MLO tomo · tomo slice 33/65.0]
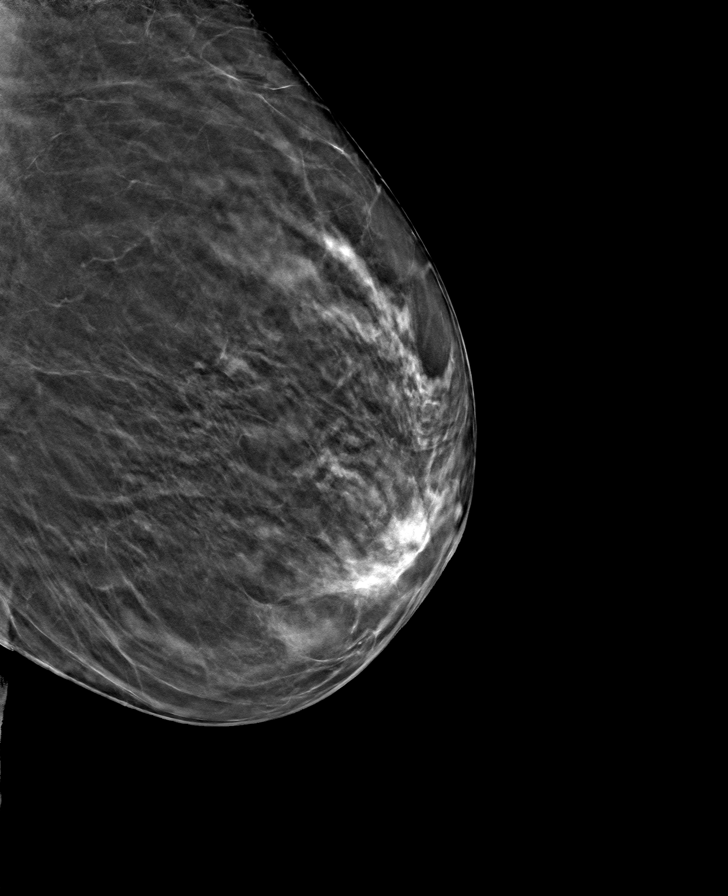

[R CC tomo · tomo slice 33/65.0]
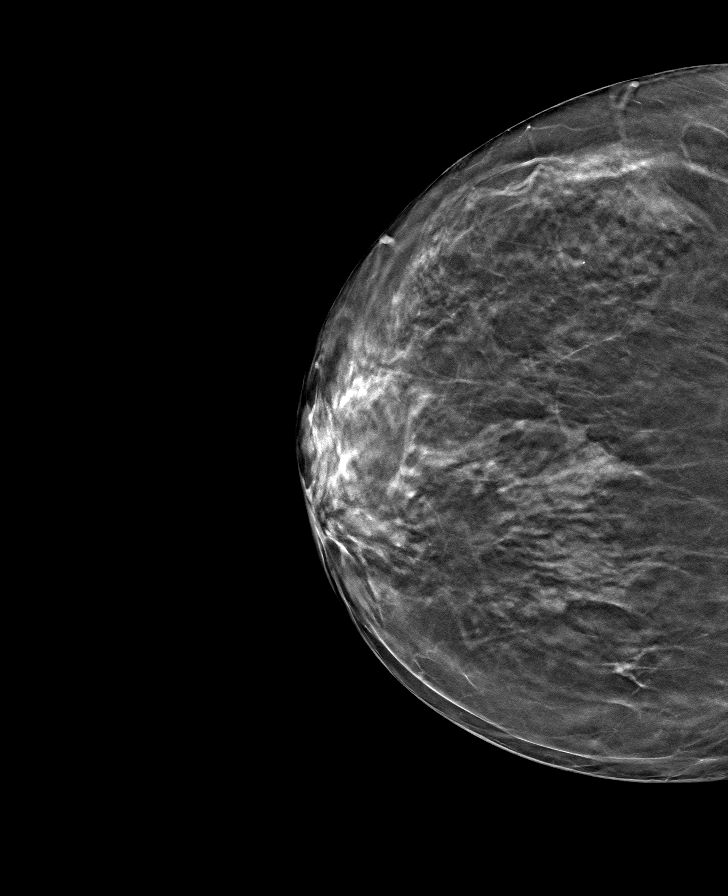

[L CC tomo · tomo slice 31/62.0]
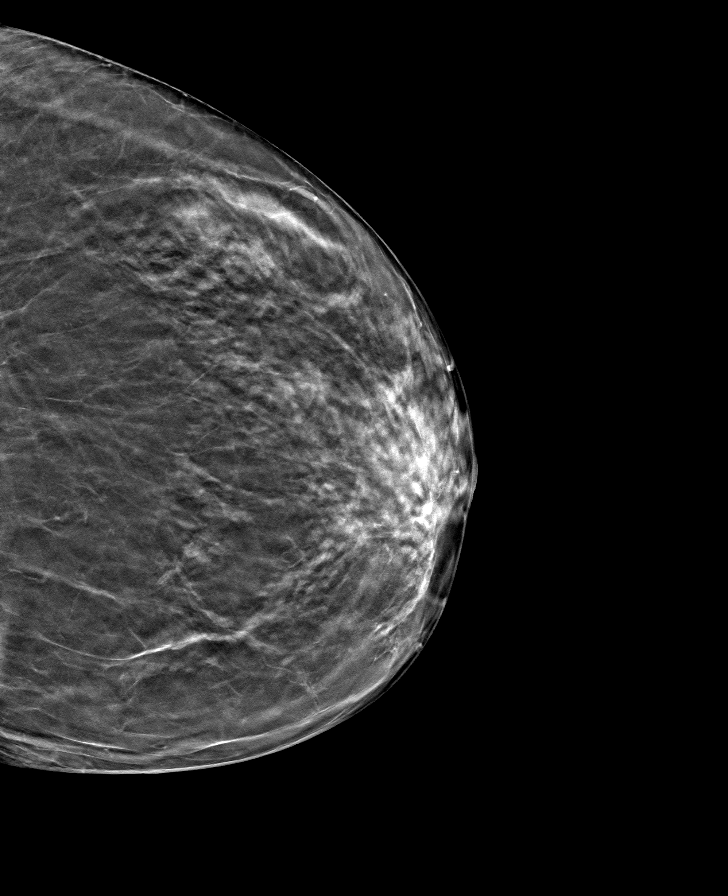

[8 of 24 positions shown; findings below may reference images not displayed]

ACR Breast Density Category b: There are scattered areas of
fibroglandular density.
FINDINGS: There are no findings suspicious for malignancy.
IMPRESSION: No mammographic evidence of malignancy. A result letter of this
screening mammogram will be mailed directly to the patient.

RECOMMENDATION:
Screening mammogram in one year. (Code:[BY])

BI-RADS CATEGORY  1: Negative.

## 2021-12-13 DIAGNOSIS — M19012 Primary osteoarthritis, left shoulder: Secondary | ICD-10-CM | POA: Diagnosis not present

## 2021-12-13 DIAGNOSIS — Z7982 Long term (current) use of aspirin: Secondary | ICD-10-CM | POA: Diagnosis not present

## 2021-12-13 DIAGNOSIS — M19011 Primary osteoarthritis, right shoulder: Secondary | ICD-10-CM | POA: Diagnosis not present

## 2021-12-13 DIAGNOSIS — Z96612 Presence of left artificial shoulder joint: Secondary | ICD-10-CM | POA: Insufficient documentation

## 2021-12-13 DIAGNOSIS — G8929 Other chronic pain: Secondary | ICD-10-CM | POA: Diagnosis not present

## 2021-12-13 DIAGNOSIS — R030 Elevated blood-pressure reading, without diagnosis of hypertension: Secondary | ICD-10-CM | POA: Diagnosis not present

## 2021-12-13 DIAGNOSIS — Z471 Aftercare following joint replacement surgery: Secondary | ICD-10-CM | POA: Diagnosis not present

## 2021-12-13 DIAGNOSIS — G8918 Other acute postprocedural pain: Secondary | ICD-10-CM | POA: Diagnosis not present

## 2021-12-13 DIAGNOSIS — R011 Cardiac murmur, unspecified: Secondary | ICD-10-CM | POA: Diagnosis not present

## 2021-12-13 DIAGNOSIS — M75102 Unspecified rotator cuff tear or rupture of left shoulder, not specified as traumatic: Secondary | ICD-10-CM | POA: Diagnosis not present

## 2021-12-14 DIAGNOSIS — M19012 Primary osteoarthritis, left shoulder: Secondary | ICD-10-CM | POA: Diagnosis not present

## 2021-12-14 DIAGNOSIS — G8918 Other acute postprocedural pain: Secondary | ICD-10-CM | POA: Diagnosis not present

## 2021-12-14 DIAGNOSIS — Z96612 Presence of left artificial shoulder joint: Secondary | ICD-10-CM | POA: Diagnosis not present

## 2021-12-18 DIAGNOSIS — M19012 Primary osteoarthritis, left shoulder: Secondary | ICD-10-CM | POA: Diagnosis not present

## 2021-12-27 DIAGNOSIS — Z96612 Presence of left artificial shoulder joint: Secondary | ICD-10-CM | POA: Diagnosis not present

## 2021-12-27 DIAGNOSIS — M19012 Primary osteoarthritis, left shoulder: Secondary | ICD-10-CM | POA: Diagnosis not present

## 2021-12-27 DIAGNOSIS — Z471 Aftercare following joint replacement surgery: Secondary | ICD-10-CM | POA: Diagnosis not present

## 2021-12-27 DIAGNOSIS — M19011 Primary osteoarthritis, right shoulder: Secondary | ICD-10-CM | POA: Diagnosis not present

## 2022-01-15 DIAGNOSIS — Z85828 Personal history of other malignant neoplasm of skin: Secondary | ICD-10-CM | POA: Diagnosis not present

## 2022-01-15 DIAGNOSIS — L820 Inflamed seborrheic keratosis: Secondary | ICD-10-CM | POA: Diagnosis not present

## 2022-01-15 DIAGNOSIS — C44629 Squamous cell carcinoma of skin of left upper limb, including shoulder: Secondary | ICD-10-CM | POA: Diagnosis not present

## 2022-01-28 DIAGNOSIS — M19012 Primary osteoarthritis, left shoulder: Secondary | ICD-10-CM | POA: Diagnosis not present

## 2022-01-28 DIAGNOSIS — Z471 Aftercare following joint replacement surgery: Secondary | ICD-10-CM | POA: Diagnosis not present

## 2022-01-28 DIAGNOSIS — Z96612 Presence of left artificial shoulder joint: Secondary | ICD-10-CM | POA: Diagnosis not present

## 2022-01-28 DIAGNOSIS — M19011 Primary osteoarthritis, right shoulder: Secondary | ICD-10-CM | POA: Diagnosis not present

## 2022-02-04 DIAGNOSIS — M25511 Pain in right shoulder: Secondary | ICD-10-CM | POA: Diagnosis not present

## 2022-02-04 DIAGNOSIS — M792 Neuralgia and neuritis, unspecified: Secondary | ICD-10-CM | POA: Diagnosis not present

## 2022-02-11 DIAGNOSIS — E7800 Pure hypercholesterolemia, unspecified: Secondary | ICD-10-CM | POA: Diagnosis not present

## 2022-02-11 DIAGNOSIS — J309 Allergic rhinitis, unspecified: Secondary | ICD-10-CM | POA: Diagnosis not present

## 2022-02-11 DIAGNOSIS — M545 Low back pain, unspecified: Secondary | ICD-10-CM | POA: Diagnosis not present

## 2022-02-11 DIAGNOSIS — D72819 Decreased white blood cell count, unspecified: Secondary | ICD-10-CM | POA: Diagnosis not present

## 2022-02-11 DIAGNOSIS — M818 Other osteoporosis without current pathological fracture: Secondary | ICD-10-CM | POA: Diagnosis not present

## 2022-02-11 DIAGNOSIS — Z0000 Encounter for general adult medical examination without abnormal findings: Secondary | ICD-10-CM | POA: Diagnosis not present

## 2022-02-11 DIAGNOSIS — N39498 Other specified urinary incontinence: Secondary | ICD-10-CM | POA: Diagnosis not present

## 2022-02-11 DIAGNOSIS — R229 Localized swelling, mass and lump, unspecified: Secondary | ICD-10-CM | POA: Diagnosis not present

## 2022-02-11 DIAGNOSIS — Z6824 Body mass index (BMI) 24.0-24.9, adult: Secondary | ICD-10-CM | POA: Diagnosis not present

## 2022-02-13 DIAGNOSIS — J069 Acute upper respiratory infection, unspecified: Secondary | ICD-10-CM | POA: Diagnosis not present

## 2022-02-13 DIAGNOSIS — R0981 Nasal congestion: Secondary | ICD-10-CM | POA: Diagnosis not present

## 2022-02-25 DIAGNOSIS — H353131 Nonexudative age-related macular degeneration, bilateral, early dry stage: Secondary | ICD-10-CM | POA: Diagnosis not present

## 2022-03-07 DIAGNOSIS — H353131 Nonexudative age-related macular degeneration, bilateral, early dry stage: Secondary | ICD-10-CM | POA: Diagnosis not present

## 2022-03-07 DIAGNOSIS — H35371 Puckering of macula, right eye: Secondary | ICD-10-CM | POA: Diagnosis not present

## 2022-03-07 DIAGNOSIS — H33311 Horseshoe tear of retina without detachment, right eye: Secondary | ICD-10-CM | POA: Diagnosis not present

## 2022-03-11 DIAGNOSIS — M19012 Primary osteoarthritis, left shoulder: Secondary | ICD-10-CM | POA: Diagnosis not present

## 2022-03-11 DIAGNOSIS — Z471 Aftercare following joint replacement surgery: Secondary | ICD-10-CM | POA: Diagnosis not present

## 2022-03-11 DIAGNOSIS — M19011 Primary osteoarthritis, right shoulder: Secondary | ICD-10-CM | POA: Diagnosis not present

## 2022-03-11 DIAGNOSIS — Z96612 Presence of left artificial shoulder joint: Secondary | ICD-10-CM | POA: Diagnosis not present

## 2022-03-12 ENCOUNTER — Other Ambulatory Visit: Payer: Self-pay | Admitting: Neurosurgery

## 2022-03-12 DIAGNOSIS — S22000A Wedge compression fracture of unspecified thoracic vertebra, initial encounter for closed fracture: Secondary | ICD-10-CM

## 2022-03-15 DIAGNOSIS — M85852 Other specified disorders of bone density and structure, left thigh: Secondary | ICD-10-CM | POA: Diagnosis not present

## 2022-03-15 DIAGNOSIS — M810 Age-related osteoporosis without current pathological fracture: Secondary | ICD-10-CM | POA: Diagnosis not present

## 2022-03-21 DIAGNOSIS — H35371 Puckering of macula, right eye: Secondary | ICD-10-CM | POA: Diagnosis not present

## 2022-03-21 DIAGNOSIS — H33322 Round hole, left eye: Secondary | ICD-10-CM | POA: Diagnosis not present

## 2022-03-21 DIAGNOSIS — H353131 Nonexudative age-related macular degeneration, bilateral, early dry stage: Secondary | ICD-10-CM | POA: Diagnosis not present

## 2022-03-23 ENCOUNTER — Ambulatory Visit
Admission: RE | Admit: 2022-03-23 | Discharge: 2022-03-23 | Disposition: A | Discharge status: Home / Self Care | Payer: PPO | Source: Ambulatory Visit | Attending: Neurosurgery | Admitting: Neurosurgery

## 2022-03-23 DIAGNOSIS — S22000A Wedge compression fracture of unspecified thoracic vertebra, initial encounter for closed fracture: Secondary | ICD-10-CM

## 2022-03-23 DIAGNOSIS — M546 Pain in thoracic spine: Secondary | ICD-10-CM | POA: Diagnosis not present

## 2022-03-23 IMAGING — MR MR THORACIC SPINE W/O CM
4 of 6 series · 23 of 48 positions shown · non-contrast
Comparison: Correlation made with thoracic spine CT from [QB]

CLINICAL DATA: Closed compression fracture of thoracic vertebrae,
initial encounter; technologist note states mid back pain

EXAM:
MRI THORACIC SPINE WITHOUT CONTRAST
TECHNIQUE: Multiplanar, multisequence MR imaging of the thoracic spine was
performed. No intravenous contrast was administered.

[Series 2: T1 · sagittal · 4.0mm · 1.25mm/px · 4 of 14 slices shown (1 of 2)]
[im 1/14]
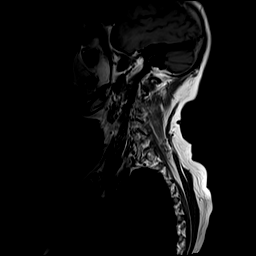
[im 5/14]
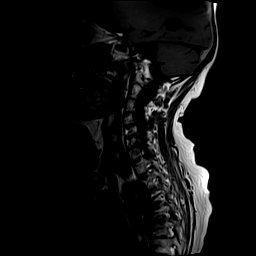
[im 9/14]
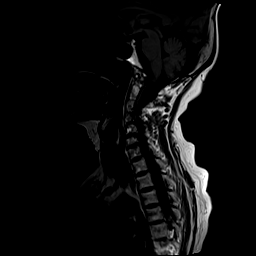
[im 14/14]
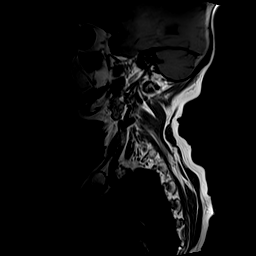

[Series 5: T2 · sagittal · 3.0mm · 0.62mm/px · 6 of 20 slices shown (1 of 2)]
[im 1/20]
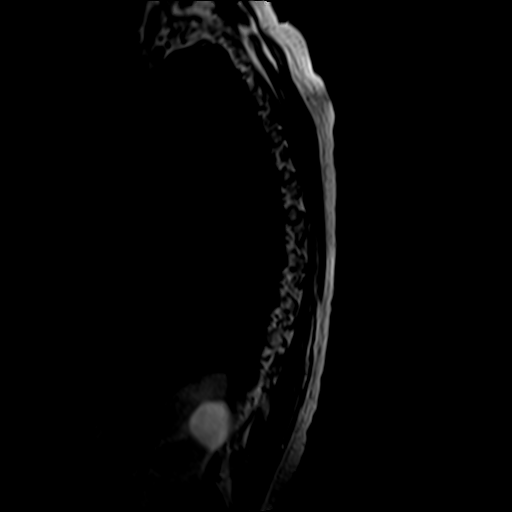
[im 4/20]
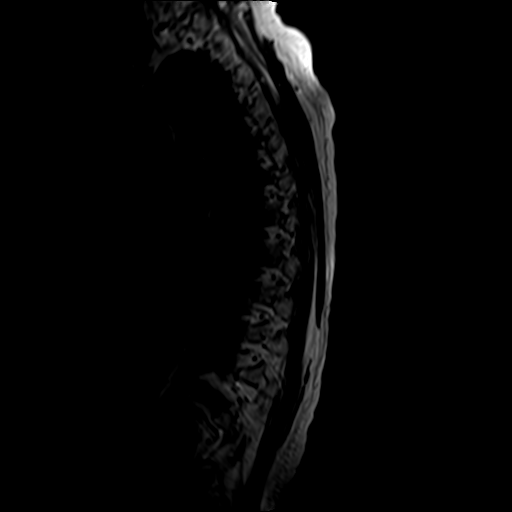
[im 8/20]
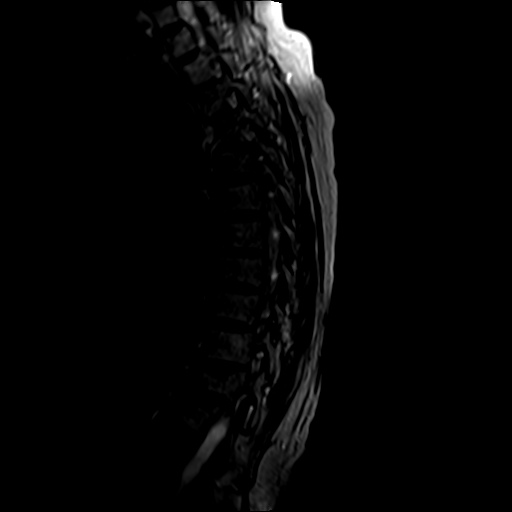
[im 12/20]
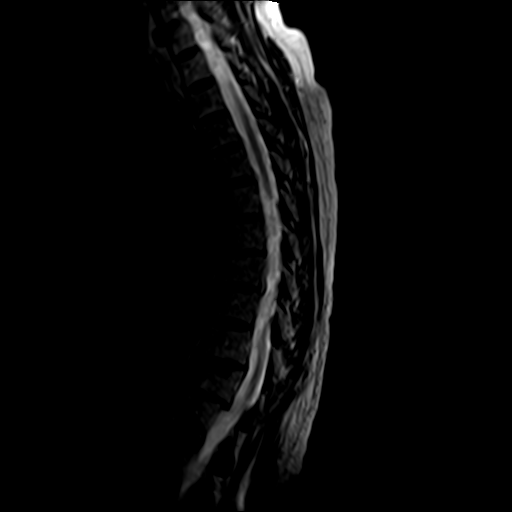
[im 16/20]
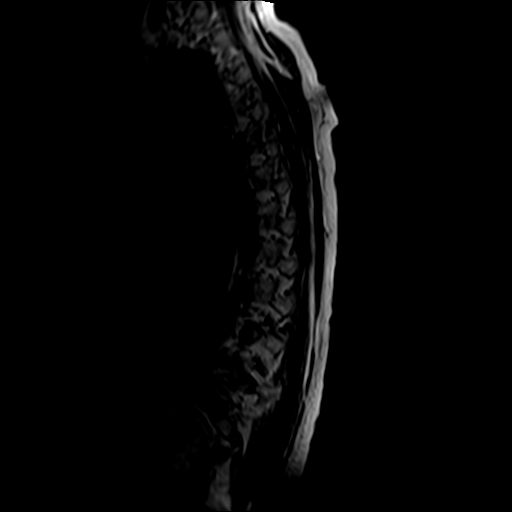
[im 20/20]
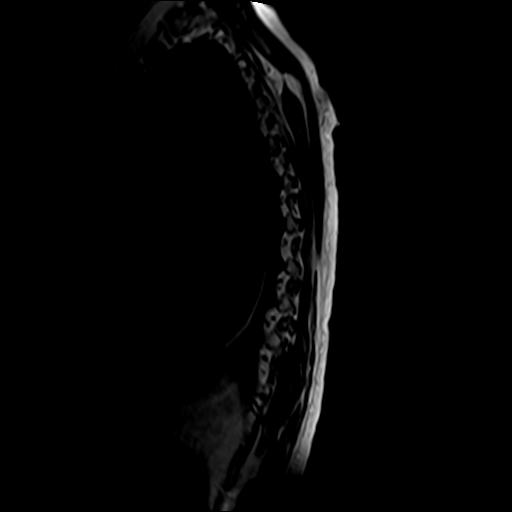

[Series 6: T1 · sagittal · 3.0mm · 0.62mm/px · 4 of 20 slices shown (2 of 2)]
[im 1/20]
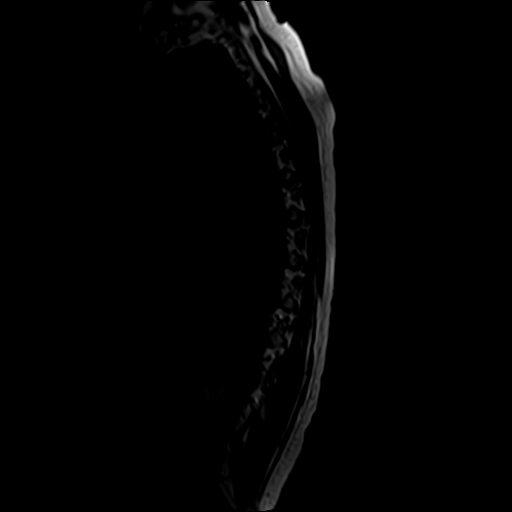
[im 4/20]
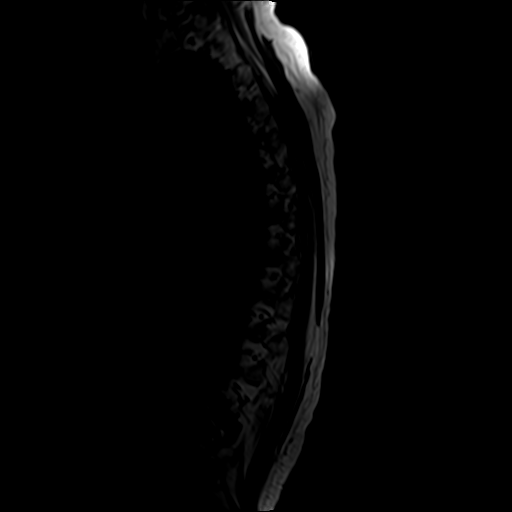
[im 12/20]
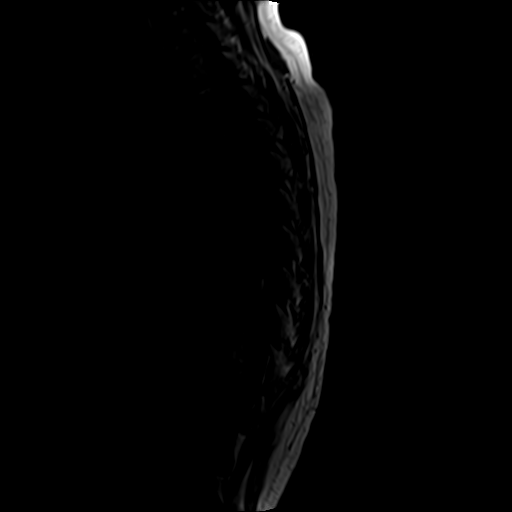
[im 20/20]
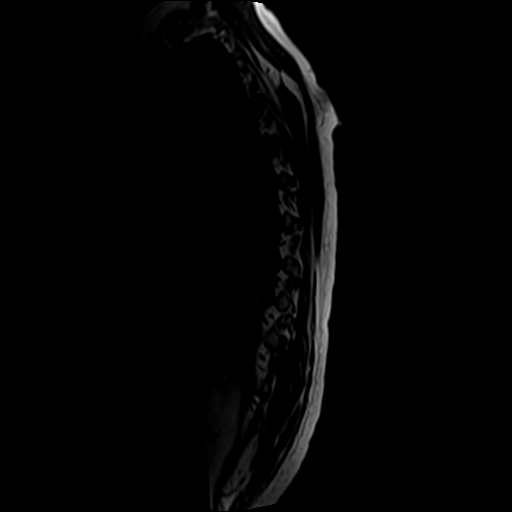

[Series 7: T2 · axial · 4.0mm · 0.39mm/px · z∈[-277,-71]mm · 9 of 43 slices shown (2 of 2)]
[im 1/43]
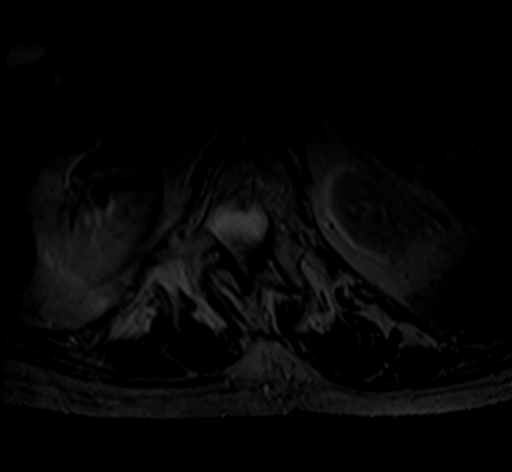
[im 8/43]
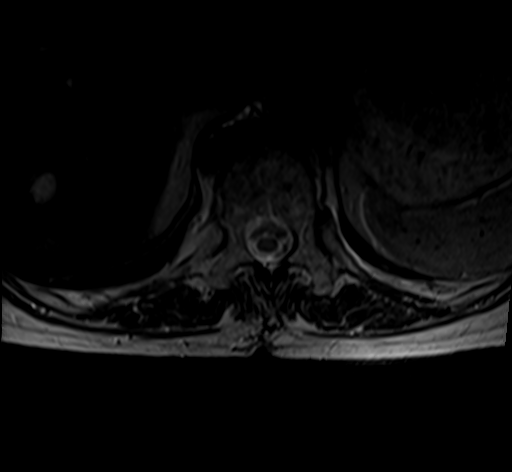
[im 15/43]
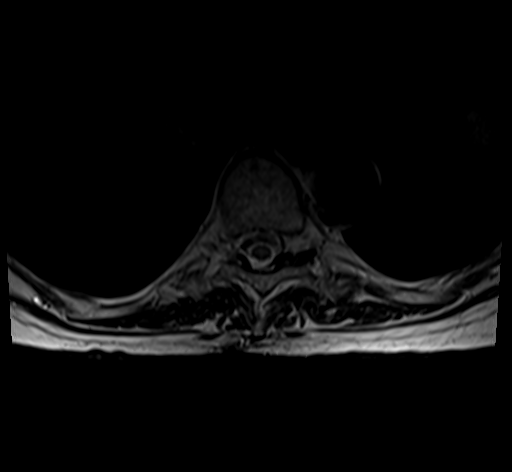
[im 18/43]
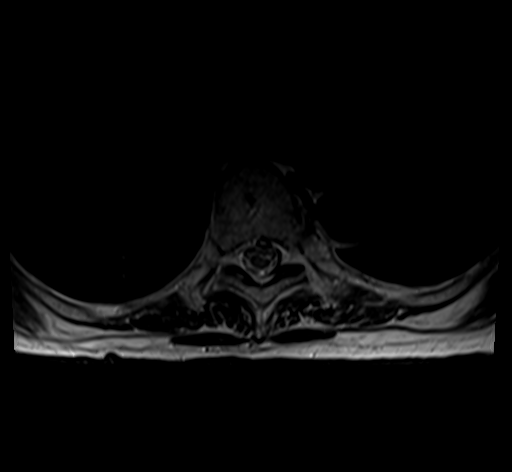
[im 22/43]
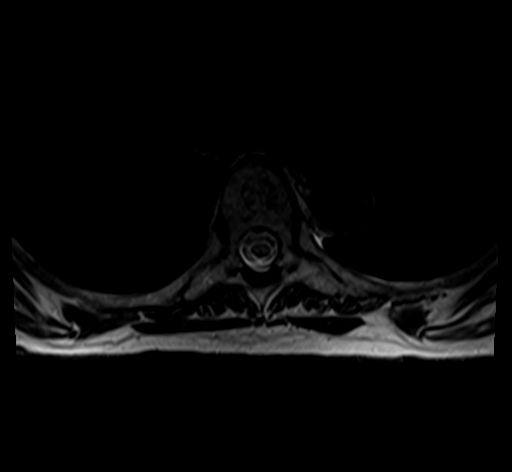
[im 25/43]
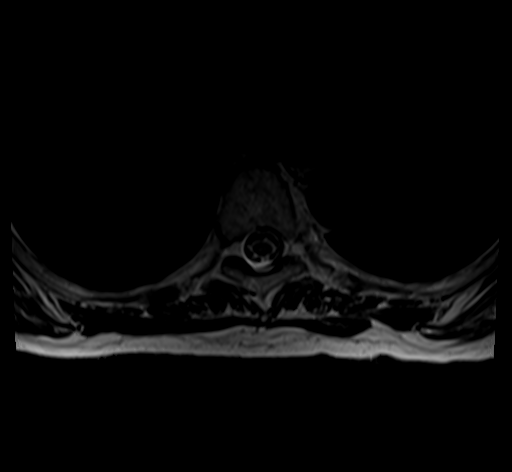
[im 29/43]
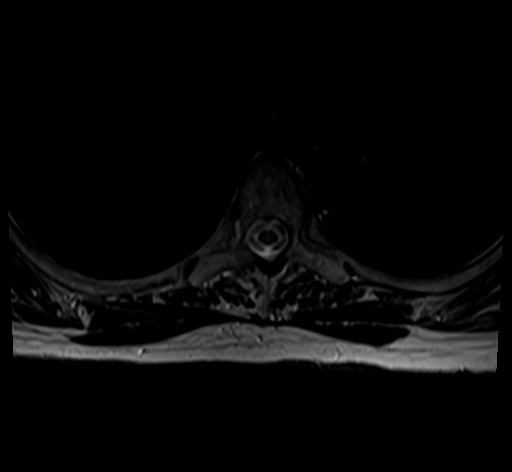
[im 36/43]
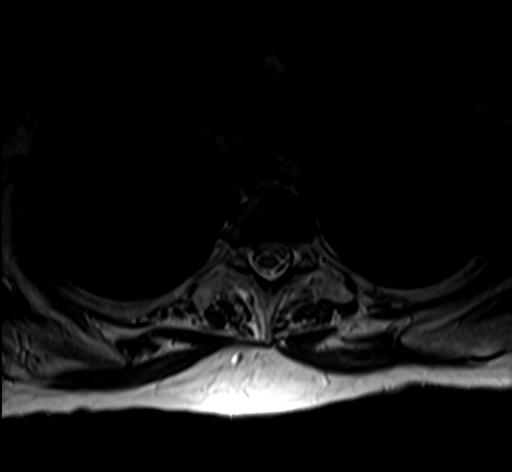
[im 43/43]
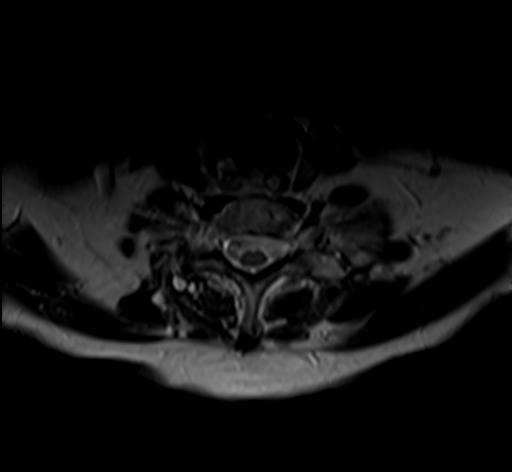

[23 of 48 positions shown; findings below may reference images not displayed]

FINDINGS: Alignment: Retrolisthesis at T11-T12 and mild T11 endplate
retropulsion.

Vertebrae: Vertebral body heights are similar to the prior CT with
chronic T11 compression fracture. There is no substantial marrow
edema. No suspicious osseous lesion.

Cord:  No abnormal signal.

Paraspinal and other soft tissues: Unremarkable.

Disc levels:

Overall mild multilevel degenerative disc disease. There are a few
disc bulges and protrusions. For example, central protrusions at
T4-T5 and T5-T6 and left central protrusion at T8-T9. There is mild
facet arthropathy and ligamentum flavum thickening. For example at
T11-T12 with small joint effusions. There is no significant canal
stenosis. Foraminal narrowing is present at T11-T12.
IMPRESSION: Chronic T11 compression fracture. No evidence of recent compression
fracture.

Mild degenerative changes. No significant canal stenosis. Foraminal
narrowing at T11-T12.

## 2022-04-11 DIAGNOSIS — M791 Myalgia, unspecified site: Secondary | ICD-10-CM | POA: Diagnosis not present

## 2022-04-11 DIAGNOSIS — R202 Paresthesia of skin: Secondary | ICD-10-CM | POA: Diagnosis not present

## 2022-04-25 DIAGNOSIS — H3321 Serous retinal detachment, right eye: Secondary | ICD-10-CM | POA: Diagnosis not present

## 2022-04-25 DIAGNOSIS — H35371 Puckering of macula, right eye: Secondary | ICD-10-CM | POA: Diagnosis not present

## 2022-04-25 DIAGNOSIS — H00012 Hordeolum externum right lower eyelid: Secondary | ICD-10-CM | POA: Diagnosis not present

## 2022-04-25 DIAGNOSIS — H33322 Round hole, left eye: Secondary | ICD-10-CM | POA: Diagnosis not present

## 2022-05-01 DIAGNOSIS — R300 Dysuria: Secondary | ICD-10-CM | POA: Diagnosis not present

## 2022-05-01 DIAGNOSIS — N3001 Acute cystitis with hematuria: Secondary | ICD-10-CM | POA: Diagnosis not present

## 2022-05-13 DIAGNOSIS — M792 Neuralgia and neuritis, unspecified: Secondary | ICD-10-CM | POA: Diagnosis not present

## 2022-05-14 DIAGNOSIS — R233 Spontaneous ecchymoses: Secondary | ICD-10-CM | POA: Diagnosis not present

## 2022-05-28 DIAGNOSIS — G8929 Other chronic pain: Secondary | ICD-10-CM | POA: Diagnosis not present

## 2022-05-28 DIAGNOSIS — M546 Pain in thoracic spine: Secondary | ICD-10-CM | POA: Diagnosis not present

## 2022-06-10 DIAGNOSIS — M19012 Primary osteoarthritis, left shoulder: Secondary | ICD-10-CM | POA: Diagnosis not present

## 2022-06-10 DIAGNOSIS — M19011 Primary osteoarthritis, right shoulder: Secondary | ICD-10-CM | POA: Diagnosis not present

## 2022-06-10 DIAGNOSIS — Z471 Aftercare following joint replacement surgery: Secondary | ICD-10-CM | POA: Diagnosis not present

## 2022-06-10 DIAGNOSIS — M8580 Other specified disorders of bone density and structure, unspecified site: Secondary | ICD-10-CM | POA: Diagnosis not present

## 2022-06-10 DIAGNOSIS — Z96612 Presence of left artificial shoulder joint: Secondary | ICD-10-CM | POA: Diagnosis not present

## 2022-06-18 DIAGNOSIS — M47814 Spondylosis without myelopathy or radiculopathy, thoracic region: Secondary | ICD-10-CM | POA: Diagnosis not present

## 2022-07-04 DIAGNOSIS — M47814 Spondylosis without myelopathy or radiculopathy, thoracic region: Secondary | ICD-10-CM | POA: Diagnosis not present

## 2022-07-18 DIAGNOSIS — M8000XG Age-related osteoporosis with current pathological fracture, unspecified site, subsequent encounter for fracture with delayed healing: Secondary | ICD-10-CM | POA: Diagnosis not present

## 2022-07-18 DIAGNOSIS — S22080B Wedge compression fracture of T11-T12 vertebra, initial encounter for open fracture: Secondary | ICD-10-CM | POA: Diagnosis not present

## 2022-07-18 DIAGNOSIS — M810 Age-related osteoporosis without current pathological fracture: Secondary | ICD-10-CM | POA: Diagnosis not present

## 2022-07-18 DIAGNOSIS — S22080A Wedge compression fracture of T11-T12 vertebra, initial encounter for closed fracture: Secondary | ICD-10-CM | POA: Diagnosis not present

## 2022-07-25 DIAGNOSIS — M8000XG Age-related osteoporosis with current pathological fracture, unspecified site, subsequent encounter for fracture with delayed healing: Secondary | ICD-10-CM | POA: Diagnosis not present

## 2022-07-25 DIAGNOSIS — M47814 Spondylosis without myelopathy or radiculopathy, thoracic region: Secondary | ICD-10-CM | POA: Diagnosis not present

## 2022-08-06 ENCOUNTER — Other Ambulatory Visit: Payer: Self-pay | Admitting: Endocrinology

## 2022-08-06 ENCOUNTER — Other Ambulatory Visit (HOSPITAL_COMMUNITY): Payer: Self-pay | Admitting: Endocrinology

## 2022-08-06 DIAGNOSIS — E210 Primary hyperparathyroidism: Secondary | ICD-10-CM

## 2022-08-13 DIAGNOSIS — M810 Age-related osteoporosis without current pathological fracture: Secondary | ICD-10-CM | POA: Diagnosis not present

## 2022-08-13 DIAGNOSIS — M47814 Spondylosis without myelopathy or radiculopathy, thoracic region: Secondary | ICD-10-CM | POA: Diagnosis not present

## 2022-08-18 DIAGNOSIS — R5382 Chronic fatigue, unspecified: Secondary | ICD-10-CM | POA: Diagnosis not present

## 2022-08-23 ENCOUNTER — Ambulatory Visit (HOSPITAL_COMMUNITY)
Admission: RE | Admit: 2022-08-23 | Discharge: 2022-08-23 | Disposition: A | Discharge status: Home / Self Care | Payer: PPO | Source: Ambulatory Visit | Attending: Endocrinology | Admitting: Endocrinology

## 2022-08-23 DIAGNOSIS — E049 Nontoxic goiter, unspecified: Secondary | ICD-10-CM | POA: Diagnosis not present

## 2022-08-23 DIAGNOSIS — Z8739 Personal history of other diseases of the musculoskeletal system and connective tissue: Secondary | ICD-10-CM | POA: Diagnosis not present

## 2022-08-23 DIAGNOSIS — E210 Primary hyperparathyroidism: Secondary | ICD-10-CM | POA: Diagnosis not present

## 2022-08-23 MED ORDER — TECHNETIUM TC 99M SESTAMIBI - CARDIOLITE
25.4000 | Freq: Once | INTRAVENOUS | Status: AC | PRN
  Administered 2022-08-23: 25.4 via INTRAVENOUS

## 2022-08-26 DIAGNOSIS — M47814 Spondylosis without myelopathy or radiculopathy, thoracic region: Secondary | ICD-10-CM | POA: Diagnosis not present

## 2022-08-29 ENCOUNTER — Other Ambulatory Visit: Payer: Self-pay | Admitting: Endocrinology

## 2022-08-29 DIAGNOSIS — Z85828 Personal history of other malignant neoplasm of skin: Secondary | ICD-10-CM | POA: Diagnosis not present

## 2022-08-29 DIAGNOSIS — D692 Other nonthrombocytopenic purpura: Secondary | ICD-10-CM | POA: Diagnosis not present

## 2022-08-29 DIAGNOSIS — L718 Other rosacea: Secondary | ICD-10-CM | POA: Diagnosis not present

## 2022-08-29 DIAGNOSIS — L820 Inflamed seborrheic keratosis: Secondary | ICD-10-CM | POA: Diagnosis not present

## 2022-08-29 DIAGNOSIS — L821 Other seborrheic keratosis: Secondary | ICD-10-CM | POA: Diagnosis not present

## 2022-08-29 DIAGNOSIS — E010 Iodine-deficiency related diffuse (endemic) goiter: Secondary | ICD-10-CM

## 2022-09-16 ENCOUNTER — Ambulatory Visit
Admission: RE | Admit: 2022-09-16 | Discharge: 2022-09-16 | Disposition: A | Discharge status: Home / Self Care | Payer: PPO | Source: Ambulatory Visit | Attending: Endocrinology | Admitting: Endocrinology

## 2022-09-16 DIAGNOSIS — M47814 Spondylosis without myelopathy or radiculopathy, thoracic region: Secondary | ICD-10-CM | POA: Diagnosis not present

## 2022-09-16 DIAGNOSIS — E042 Nontoxic multinodular goiter: Secondary | ICD-10-CM | POA: Diagnosis not present

## 2022-09-16 DIAGNOSIS — E010 Iodine-deficiency related diffuse (endemic) goiter: Secondary | ICD-10-CM

## 2022-09-18 ENCOUNTER — Other Ambulatory Visit: Payer: Self-pay | Admitting: Endocrinology

## 2022-09-18 DIAGNOSIS — E041 Nontoxic single thyroid nodule: Secondary | ICD-10-CM

## 2022-09-25 ENCOUNTER — Other Ambulatory Visit: Payer: Self-pay | Admitting: Family Medicine

## 2022-09-25 DIAGNOSIS — Z1231 Encounter for screening mammogram for malignant neoplasm of breast: Secondary | ICD-10-CM

## 2022-10-01 ENCOUNTER — Other Ambulatory Visit: Payer: PPO

## 2022-10-03 ENCOUNTER — Ambulatory Visit
Admission: RE | Admit: 2022-10-03 | Discharge: 2022-10-03 | Disposition: A | Discharge status: Home / Self Care | Payer: PPO | Source: Ambulatory Visit | Attending: Endocrinology | Admitting: Endocrinology

## 2022-10-03 ENCOUNTER — Other Ambulatory Visit (HOSPITAL_COMMUNITY)
Admission: RE | Admit: 2022-10-03 | Discharge: 2022-10-03 | Disposition: A | Discharge status: Home / Self Care | Payer: PPO | Source: Ambulatory Visit | Attending: Endocrinology | Admitting: Endocrinology

## 2022-10-03 DIAGNOSIS — E041 Nontoxic single thyroid nodule: Secondary | ICD-10-CM | POA: Diagnosis not present

## 2022-10-03 DIAGNOSIS — E042 Nontoxic multinodular goiter: Secondary | ICD-10-CM | POA: Diagnosis not present

## 2022-10-07 LAB — CYTOLOGY - NON PAP

## 2022-10-08 DIAGNOSIS — M47814 Spondylosis without myelopathy or radiculopathy, thoracic region: Secondary | ICD-10-CM | POA: Diagnosis not present

## 2022-10-17 ENCOUNTER — Ambulatory Visit
Admission: RE | Admit: 2022-10-17 | Discharge: 2022-10-17 | Disposition: A | Discharge status: Home / Self Care | Payer: PPO | Source: Ambulatory Visit | Attending: Family Medicine | Admitting: Family Medicine

## 2022-10-17 DIAGNOSIS — Z1231 Encounter for screening mammogram for malignant neoplasm of breast: Secondary | ICD-10-CM

## 2022-11-04 DIAGNOSIS — M792 Neuralgia and neuritis, unspecified: Secondary | ICD-10-CM | POA: Diagnosis not present

## 2022-11-22 DIAGNOSIS — E210 Primary hyperparathyroidism: Secondary | ICD-10-CM | POA: Diagnosis not present

## 2022-11-22 DIAGNOSIS — S22080A Wedge compression fracture of T11-T12 vertebra, initial encounter for closed fracture: Secondary | ICD-10-CM | POA: Diagnosis not present

## 2022-11-22 DIAGNOSIS — M8000XG Age-related osteoporosis with current pathological fracture, unspecified site, subsequent encounter for fracture with delayed healing: Secondary | ICD-10-CM | POA: Diagnosis not present

## 2022-11-22 DIAGNOSIS — E049 Nontoxic goiter, unspecified: Secondary | ICD-10-CM | POA: Diagnosis not present

## 2022-12-10 DIAGNOSIS — Z1212 Encounter for screening for malignant neoplasm of rectum: Secondary | ICD-10-CM | POA: Diagnosis not present

## 2022-12-10 DIAGNOSIS — Z1211 Encounter for screening for malignant neoplasm of colon: Secondary | ICD-10-CM | POA: Diagnosis not present

## 2022-12-16 DIAGNOSIS — M19012 Primary osteoarthritis, left shoulder: Secondary | ICD-10-CM | POA: Diagnosis not present

## 2022-12-16 DIAGNOSIS — Z96612 Presence of left artificial shoulder joint: Secondary | ICD-10-CM | POA: Diagnosis not present

## 2022-12-16 DIAGNOSIS — M19011 Primary osteoarthritis, right shoulder: Secondary | ICD-10-CM | POA: Diagnosis not present

## 2022-12-16 DIAGNOSIS — Z471 Aftercare following joint replacement surgery: Secondary | ICD-10-CM | POA: Diagnosis not present

## 2022-12-19 LAB — EXTERNAL GENERIC LAB PROCEDURE: COLOGUARD: POSITIVE — AB

## 2022-12-19 LAB — COLOGUARD: COLOGUARD: POSITIVE — AB

## 2022-12-31 ENCOUNTER — Other Ambulatory Visit: Payer: Self-pay

## 2022-12-31 DIAGNOSIS — M810 Age-related osteoporosis without current pathological fracture: Secondary | ICD-10-CM

## 2023-01-02 ENCOUNTER — Telehealth: Payer: Self-pay | Admitting: Pharmacy Technician

## 2023-01-02 NOTE — Telephone Encounter (Addendum)
Auth Submission: NO AUTH NEEDED Payer: [employer] Medication & CPT/J Code(s) submitted: Prolia (Denosumab) [id number] Route of submission (phone, fax, portal):  Phone # Fax # Auth type: Buy/Bill Units/visits requested: X2 Reference number: [id number] Approval from: 01/02/23 to 12/18/24

## 2023-02-03 ENCOUNTER — Ambulatory Visit (INDEPENDENT_AMBULATORY_CARE_PROVIDER_SITE_OTHER): Payer: PPO

## 2023-02-03 VITALS — BP 125/80 | HR 85 | Temp 97.5°F | Resp 18 | Ht 64.0 in | Wt 150.8 lb

## 2023-02-03 DIAGNOSIS — M810 Age-related osteoporosis without current pathological fracture: Secondary | ICD-10-CM

## 2023-02-03 DIAGNOSIS — M722 Plantar fascial fibromatosis: Secondary | ICD-10-CM | POA: Diagnosis not present

## 2023-02-03 MED ORDER — DENOSUMAB 60 MG/ML ~~LOC~~ SOSY
60.0000 mg | PREFILLED_SYRINGE | Freq: Once | SUBCUTANEOUS | Status: AC
  Administered 2023-02-03: 60 mg via SUBCUTANEOUS
  Filled 2023-02-03: qty 1

## 2023-02-03 NOTE — Progress Notes (Signed)
Diagnosis: Osteoporosis  Provider:  Marshell Garfinkel MD  Procedure: Injection  Prolia (Denosumab), Dose: 60 mg, Site: subcutaneous, Number of injections: 1  Post Care:  NA  Discharge: Condition: Good, Destination: Home . AVS Declined  Performed by:  Arnoldo Morale, RN

## 2023-02-18 DIAGNOSIS — J309 Allergic rhinitis, unspecified: Secondary | ICD-10-CM | POA: Diagnosis not present

## 2023-02-18 DIAGNOSIS — R0981 Nasal congestion: Secondary | ICD-10-CM | POA: Diagnosis not present

## 2023-02-22 DIAGNOSIS — R059 Cough, unspecified: Secondary | ICD-10-CM | POA: Diagnosis not present

## 2023-02-22 DIAGNOSIS — J309 Allergic rhinitis, unspecified: Secondary | ICD-10-CM | POA: Diagnosis not present

## 2023-02-25 DIAGNOSIS — J209 Acute bronchitis, unspecified: Secondary | ICD-10-CM | POA: Diagnosis not present

## 2023-02-25 DIAGNOSIS — J309 Allergic rhinitis, unspecified: Secondary | ICD-10-CM | POA: Diagnosis not present

## 2023-05-09 DIAGNOSIS — H33322 Round hole, left eye: Secondary | ICD-10-CM | POA: Diagnosis not present

## 2023-05-09 DIAGNOSIS — H524 Presbyopia: Secondary | ICD-10-CM | POA: Diagnosis not present

## 2023-08-06 ENCOUNTER — Ambulatory Visit (INDEPENDENT_AMBULATORY_CARE_PROVIDER_SITE_OTHER): Payer: PPO

## 2023-08-06 VITALS — BP 126/72 | HR 70 | Temp 98.0°F | Resp 16 | Ht 65.0 in | Wt 147.4 lb

## 2023-08-06 DIAGNOSIS — M810 Age-related osteoporosis without current pathological fracture: Secondary | ICD-10-CM | POA: Diagnosis not present

## 2023-08-06 DIAGNOSIS — M792 Neuralgia and neuritis, unspecified: Secondary | ICD-10-CM | POA: Diagnosis not present

## 2023-08-06 MED ORDER — DENOSUMAB 60 MG/ML ~~LOC~~ SOSY
60.0000 mg | PREFILLED_SYRINGE | Freq: Once | SUBCUTANEOUS | Status: AC
  Administered 2023-08-06: 60 mg via SUBCUTANEOUS
  Filled 2023-08-06: qty 1

## 2023-08-06 NOTE — Progress Notes (Signed)
Diagnosis: Osteoporosis  Provider:  Chilton Greathouse MD  Procedure: Injection  Prolia (Denosumab), Dose: 60 mg, Site: subcutaneous, Number of injections: 1  Post Care: Patient declined observation  Discharge: Condition: Good, Destination: Home . AVS Provided  Performed by:  Rico Ala, LPN

## 2023-08-18 DIAGNOSIS — Z6824 Body mass index (BMI) 24.0-24.9, adult: Secondary | ICD-10-CM | POA: Diagnosis not present

## 2023-08-18 DIAGNOSIS — E7800 Pure hypercholesterolemia, unspecified: Secondary | ICD-10-CM | POA: Diagnosis not present

## 2023-08-18 DIAGNOSIS — M545 Low back pain, unspecified: Secondary | ICD-10-CM | POA: Diagnosis not present

## 2023-08-18 DIAGNOSIS — M818 Other osteoporosis without current pathological fracture: Secondary | ICD-10-CM | POA: Diagnosis not present

## 2023-08-18 DIAGNOSIS — Z0000 Encounter for general adult medical examination without abnormal findings: Secondary | ICD-10-CM | POA: Diagnosis not present

## 2023-08-18 DIAGNOSIS — N39498 Other specified urinary incontinence: Secondary | ICD-10-CM | POA: Diagnosis not present

## 2023-09-09 DIAGNOSIS — L570 Actinic keratosis: Secondary | ICD-10-CM | POA: Diagnosis not present

## 2023-09-09 DIAGNOSIS — D485 Neoplasm of uncertain behavior of skin: Secondary | ICD-10-CM | POA: Diagnosis not present

## 2023-09-09 DIAGNOSIS — L821 Other seborrheic keratosis: Secondary | ICD-10-CM | POA: Diagnosis not present

## 2023-09-09 DIAGNOSIS — D692 Other nonthrombocytopenic purpura: Secondary | ICD-10-CM | POA: Diagnosis not present

## 2023-09-09 DIAGNOSIS — Z85828 Personal history of other malignant neoplasm of skin: Secondary | ICD-10-CM | POA: Diagnosis not present

## 2023-09-09 DIAGNOSIS — D045 Carcinoma in situ of skin of trunk: Secondary | ICD-10-CM | POA: Diagnosis not present

## 2023-09-09 DIAGNOSIS — D0472 Carcinoma in situ of skin of left lower limb, including hip: Secondary | ICD-10-CM | POA: Diagnosis not present

## 2023-09-09 DIAGNOSIS — D2239 Melanocytic nevi of other parts of face: Secondary | ICD-10-CM | POA: Diagnosis not present

## 2023-09-09 DIAGNOSIS — D225 Melanocytic nevi of trunk: Secondary | ICD-10-CM | POA: Diagnosis not present

## 2023-09-30 DIAGNOSIS — Z85828 Personal history of other malignant neoplasm of skin: Secondary | ICD-10-CM | POA: Diagnosis not present

## 2023-09-30 DIAGNOSIS — D485 Neoplasm of uncertain behavior of skin: Secondary | ICD-10-CM | POA: Diagnosis not present

## 2023-09-30 DIAGNOSIS — C44529 Squamous cell carcinoma of skin of other part of trunk: Secondary | ICD-10-CM | POA: Diagnosis not present

## 2023-09-30 DIAGNOSIS — L905 Scar conditions and fibrosis of skin: Secondary | ICD-10-CM | POA: Diagnosis not present

## 2023-10-07 DIAGNOSIS — M19012 Primary osteoarthritis, left shoulder: Secondary | ICD-10-CM | POA: Diagnosis not present

## 2023-11-23 ENCOUNTER — Other Ambulatory Visit: Payer: Self-pay | Admitting: Medical Genetics

## 2023-11-24 DIAGNOSIS — E210 Primary hyperparathyroidism: Secondary | ICD-10-CM | POA: Diagnosis not present

## 2023-11-24 DIAGNOSIS — M8000XG Age-related osteoporosis with current pathological fracture, unspecified site, subsequent encounter for fracture with delayed healing: Secondary | ICD-10-CM | POA: Diagnosis not present

## 2023-11-25 ENCOUNTER — Ambulatory Visit: Payer: PPO | Admitting: Gastroenterology

## 2023-11-25 ENCOUNTER — Encounter: Payer: Self-pay | Admitting: Gastroenterology

## 2023-11-25 VITALS — BP 118/80 | HR 91 | Ht 65.0 in | Wt 149.5 lb

## 2023-11-25 DIAGNOSIS — R195 Other fecal abnormalities: Secondary | ICD-10-CM | POA: Diagnosis not present

## 2023-11-25 MED ORDER — PANTOPRAZOLE SODIUM 40 MG PO TBEC: 40.0000 mg | DELAYED_RELEASE_TABLET | Freq: Two times a day (BID) | ORAL | 5 refills | Status: DC

## 2023-11-25 MED ORDER — HYDROCORTISONE ACETATE 25 MG RE SUPP: 25.0000 mg | Freq: Every evening | RECTAL | 3 refills | Status: DC

## 2023-11-25 MED ORDER — NA SULFATE-K SULFATE-MG SULF 17.5-3.13-1.6 GM/177ML PO SOLN: 1.0000 | Freq: Once | ORAL | 0 refills | Status: AC

## 2023-11-25 NOTE — Progress Notes (Signed)
 11/25/2023 Stephanie Mckee [id number] [birth year]   HISTORY OF PRESENT ILLNESS: This is an 83 year old female who is new to our office.  She is here today to discuss a positive Cologuard study.  Looks like her positive Cologuard was actually in January 2024 so a year ago.  She said her daughter passed away in 2024/06/16 so she kind of lost track of time, etc.  Says that she moves her bowels well.  No rectal bleeding.  She does have a bulge in her right lower quadrant that comes and goes, sounds most consistent with a hernia.  She is scheduled to have shoulder replacement the end of February.  Her last colonoscopy was well over 10 years ago.  Past Medical History:  Diagnosis Date   Heart murmur    Osteoporosis    Thoracic degenerative disc disease    Thyroid  nodule    Past Surgical History:  Procedure Laterality Date   CATARACT EXTRACTION Right    thumb surgery      reports that she has never smoked. She has never used smokeless tobacco. She reports current alcohol  use. She reports that she does not use drugs. family history includes Bone cancer in her father; Breast cancer in her daughter and maternal aunt; Dementia in her mother; Healthy in her sister; Multiple myeloma in her father; Renal cancer in her father. Allergies  Allergen Reactions   Actonel [Risedronate Sodium]     Decreased bone density    Hydromorphone Nausea And Vomiting   Prochlorperazine Other (See Comments)    Muscle seizure (drooling, tongue turns sideways; weird things)      Outpatient Encounter Medications as of 11/25/2023  Medication Sig   Apoaequorin (PREVAGEN PO) Take 1 capsule by mouth daily.   Biotin w/ Vitamins C & E (HAIR/SKIN/NAILS PO) Take 1 tablet by mouth daily.   cetirizine (ZYRTEC) 10 MG tablet Take 10 mg by mouth daily.   Cholecalciferol  (VITAMIN D ) 50 MCG (2000 UT) CAPS Take 2,000 Units by mouth daily.   Cyanocobalamin  (VITAMIN B-12) 5000 MCG TBDP Take 5,000 mcg by mouth daily.    cyclobenzaprine  (FLEXERIL ) 10 MG tablet Take 10 mg by mouth at bedtime as needed for muscle spasms.   cyclobenzaprine  (FLEXERIL ) 10 MG tablet Take 1 tablet (10 mg total) by mouth 3 (three) times daily as needed for muscle spasms.   gabapentin  (NEURONTIN ) 300 MG capsule Take 300 mg by mouth at bedtime.   GLUCOSAMINE-CHONDROITIN PO Take 1 tablet by mouth daily.   Krill Oil 500 MG CAPS Take 500 mg by mouth daily.   Multiple Vitamin (MULTIVITAMIN WITH MINERALS) TABS tablet Take 1 tablet by mouth daily.   Multiple Vitamins-Minerals (PRESERVISION AREDS 2 PO) Take 1 capsule by mouth in the morning and at bedtime.   Potassium 99 MG TABS Take 99 mg by mouth daily.   traMADol  (ULTRAM ) 50 MG tablet Take 50-100 mg by mouth at bedtime as needed for moderate pain.   [DISCONTINUED] celecoxib  (CELEBREX ) 200 MG capsule Take 200 mg by mouth 2 (two) times daily.   [DISCONTINUED] TURMERIC PO Take 2 capsules by mouth daily.   No facility-administered encounter medications on file as of 11/25/2023.    REVIEW OF SYSTEMS  : All other systems reviewed and negative except where noted in the History of Present Illness.   PHYSICAL EXAM: BP 118/80 (BP Location: Left Arm, Patient Position: Sitting, Cuff Size: Normal)   Pulse 91   Ht 5' 5 (1.651 m)   Wt 149 lb  8 oz (67.8 kg)   SpO2 94%   BMI 24.88 kg/m  General: Well developed white female in no acute distress Head: Normocephalic and atraumatic Eyes:  Sclerae anicteric, conjunctiva pink. Ears: Normal auditory acuity Lungs: Clear throughout to auscultation; no W/R/R. Heart: Regular rate and rhythm; murmur noted. Abdomen: Soft, non-distended.  BS present.  Non-tender.  Reducible bulge in RLQ c/w hernia, non-tender. Rectal:  Will be done at the time of colonoscopy Musculoskeletal: Symmetrical with no gross deformities  Skin: No lesions on visible extremities Extremities: No edema  Neurological: Alert oriented x 4, grossly non-focal Psychological:  Alert and  cooperative. Normal mood and affect  ASSESSMENT AND PLAN: *Positive cologuard:  Had a positive cologuard study on year ago.  No colonoscopy in over 10 years.  Will schedule for colonoscopy with Dr. Stacia.  The risks, benefits, and alternatives to colonoscopy were discussed with the patient and she consents to proceed.    CC:  Gayl Males, MD

## 2023-11-25 NOTE — Patient Instructions (Addendum)
 You have been scheduled for a colonoscopy. Please follow written instructions given to you at your visit today.   Please pick up your prep supplies at the pharmacy within the next 1-3 days.  If you use inhalers (even only as needed), please bring them with you on the day of your procedure.  DO NOT TAKE 7 DAYS PRIOR TO TEST- Trulicity (dulaglutide) Ozempic, Wegovy (semaglutide) Mounjaro (tirzepatide) Bydureon Bcise (exanatide extended release)  DO NOT TAKE 1 DAY PRIOR TO YOUR TEST Rybelsus (semaglutide) Adlyxin (lixisenatide) Victoza (liraglutide) Byetta (exanatide) ______________________________________________________________________  _______________________________________________________  If your blood pressure at your visit was 140/90 or greater, please contact your primary care physician to follow up on this.  _______________________________________________________  If you are age 81 or older, your body mass index should be between 23-30. Your Body mass index is 24.88 kg/m. If this is out of the aforementioned range listed, please consider follow up with your Primary Care Provider.  If you are age 29 or younger, your body mass index should be between 19-25. Your Body mass index is 24.88 kg/m. If this is out of the aformentioned range listed, please consider follow up with your Primary Care Provider.   ________________________________________________________  The [facility] providers would like to encourage you to use MYCHART to communicate with providers for non-urgent requests or questions.  Due to long hold times on the telephone, sending your provider a message by [facility] may be a faster and more efficient way to get a response.  Please allow 48 business hours for a response.  Please remember that this is for non-urgent requests.  _______________________________________________________

## 2023-12-01 ENCOUNTER — Other Ambulatory Visit: Payer: Self-pay | Admitting: Family Medicine

## 2023-12-01 DIAGNOSIS — Z1231 Encounter for screening mammogram for malignant neoplasm of breast: Secondary | ICD-10-CM

## 2023-12-01 DIAGNOSIS — M8000XG Age-related osteoporosis with current pathological fracture, unspecified site, subsequent encounter for fracture with delayed healing: Secondary | ICD-10-CM | POA: Diagnosis not present

## 2023-12-01 DIAGNOSIS — E049 Nontoxic goiter, unspecified: Secondary | ICD-10-CM | POA: Diagnosis not present

## 2023-12-01 DIAGNOSIS — M75121 Complete rotator cuff tear or rupture of right shoulder, not specified as traumatic: Secondary | ICD-10-CM | POA: Diagnosis not present

## 2023-12-01 DIAGNOSIS — E210 Primary hyperparathyroidism: Secondary | ICD-10-CM | POA: Diagnosis not present

## 2023-12-01 DIAGNOSIS — S22080A Wedge compression fracture of T11-T12 vertebra, initial encounter for closed fracture: Secondary | ICD-10-CM | POA: Diagnosis not present

## 2023-12-01 NOTE — Progress Notes (Signed)
 Agree with the assessment and plan as outlined by Doug Sou, PA-C.  Caisley Baxendale E. Tomasa Rand, MD  [facility]

## 2023-12-02 ENCOUNTER — Ambulatory Visit: Payer: PPO | Admitting: Gastroenterology

## 2023-12-02 ENCOUNTER — Encounter: Payer: Self-pay | Admitting: Gastroenterology

## 2023-12-02 VITALS — BP 125/33 | HR 72 | Temp 97.2°F | Resp 18 | Ht 65.0 in | Wt 149.0 lb

## 2023-12-02 DIAGNOSIS — K5730 Diverticulosis of large intestine without perforation or abscess without bleeding: Secondary | ICD-10-CM

## 2023-12-02 DIAGNOSIS — Z1211 Encounter for screening for malignant neoplasm of colon: Secondary | ICD-10-CM | POA: Diagnosis not present

## 2023-12-02 DIAGNOSIS — K635 Polyp of colon: Secondary | ICD-10-CM | POA: Diagnosis not present

## 2023-12-02 DIAGNOSIS — D120 Benign neoplasm of cecum: Secondary | ICD-10-CM

## 2023-12-02 DIAGNOSIS — D122 Benign neoplasm of ascending colon: Secondary | ICD-10-CM

## 2023-12-02 DIAGNOSIS — R195 Other fecal abnormalities: Secondary | ICD-10-CM | POA: Diagnosis not present

## 2023-12-02 DIAGNOSIS — D123 Benign neoplasm of transverse colon: Secondary | ICD-10-CM | POA: Diagnosis not present

## 2023-12-02 DIAGNOSIS — K644 Residual hemorrhoidal skin tags: Secondary | ICD-10-CM

## 2023-12-02 DIAGNOSIS — D127 Benign neoplasm of rectosigmoid junction: Secondary | ICD-10-CM

## 2023-12-02 MED ORDER — SODIUM CHLORIDE 0.9 % IV SOLN: 500.0000 mL | Freq: Once | INTRAVENOUS | Status: DC

## 2023-12-02 NOTE — Op Note (Signed)
 [facility] Patient Name: Stephanie Mckee Procedure Date: 12/02/2023 11:36 AM MRN: [id number] Endoscopist: Glendia E. Stacia , MD, [id number] Age: 83 Referring MD:  Date of Birth: 10/13/1941 Gender: Female Account #: [id number] Procedure:                Colonoscopy Indications:              Positive Cologuard test Medicines:                Monitored Anesthesia Care Procedure:                Pre-Anesthesia Assessment:                           - Prior to the procedure, a History and Physical                            was performed, and patient medications and                            allergies were reviewed. The patient's tolerance of                            previous anesthesia was also reviewed. The risks                            and benefits of the procedure and the sedation                            options and risks were discussed with the patient.                            All questions were answered, and informed consent                            was obtained. Prior Anticoagulants: The patient has                            taken no anticoagulant or antiplatelet agents. ASA                            Grade Assessment: II - A patient with mild systemic                            disease. After reviewing the risks and benefits,                            the patient was deemed in satisfactory condition to                            undergo the procedure.                           After obtaining informed consent, the colonoscope  was passed under direct vision. Throughout the                            procedure, the patient's blood pressure, pulse, and                            oxygen saturations were monitored continuously. The                            CF [id number] #[id number] was introduced through the anus                            and advanced to the the cecum, identified by                            appendiceal orifice and  ileocecal valve. The                            colonoscopy was somewhat difficult due to poor                            endoscopic visualization and a tortuous colon.                            Successful completion of the procedure was aided by                            using manual pressure. The patient tolerated the                            procedure well. The quality of the bowel                            preparation was adequate. The ileocecal valve,                            appendiceal orifice, and rectum were photographed.                            The bowel preparation used was SUPREP via split                            dose instruction. Scope In: 11:56:39 AM Scope Out: 12:31:37 PM Scope Withdrawal Time: 0 hours 24 minutes 49 seconds  Total Procedure Duration: 0 hours 34 minutes 58 seconds  Findings:                 Hemorrhoids were found on perianal exam.                           The digital rectal exam was normal. Pertinent                            negatives include normal sphincter tone and no  palpable rectal lesions.                           A 5 mm polyp was found in the cecum. The polyp was                            sessile. The polyp was removed with a cold snare.                            Resection and retrieval were complete. Estimated                            blood loss was minimal.                           Two sessile polyps were found in the ascending                            colon. The polyps were 2 to 4 mm in size. These                            polyps were removed with a cold snare. Resection                            and retrieval were complete. Estimated blood loss                            was minimal.                           A 13 mm polyp was found in the distal transverse                            colon. The polyp was flat. The polyp was removed                            with a piecemeal technique using a  cold snare.                            Resection and retrieval were complete. Estimated                            blood loss was minimal.                           An 18 mm polyp was found in the recto-sigmoid                            colon. The polyp was pedunculated. The polyp was                            removed with a hot snare. Resection and retrieval  were complete. Estimated blood loss: none.                           A few small-mouthed diverticula were found in the                            sigmoid colon and descending colon.                           The exam was otherwise normal throughout the                            examined colon.                           The retroflexed view of the distal rectum and anal                            verge was normal and showed no anal or rectal                            abnormalities. Complications:            No immediate complications. Estimated Blood Loss:     Estimated blood loss was minimal. Impression:               - Hemorrhoids found on perianal exam.                           - One 5 mm polyp in the cecum, removed with a cold                            snare. Resected and retrieved.                           - Two 2 to 4 mm polyps in the ascending colon,                            removed with a cold snare. Resected and retrieved.                           - One 13 mm polyp in the distal transverse colon,                            removed piecemeal using a cold snare. Resected and                            retrieved.                           - One 18 mm polyp at the recto-sigmoid colon,                            removed with a hot snare. Resected and retrieved.                           -  Mild diverticulosis in the sigmoid colon and in                            the descending colon.                           - The distal rectum and anal verge are normal on                             retroflexion view. Recommendation:           - Patient has a contact number available for                            emergencies. The signs and symptoms of potential                            delayed complications were discussed with the                            patient. Return to normal activities tomorrow.                            Written discharge instructions were provided to the                            patient.                           - Resume previous diet.                           - Continue present medications.                           - Await pathology results.                           - Repeat colonoscopy (date not yet determined) for                            surveillance based on pathology results. Tylon Kemmerling E. Stacia, MD 12/02/2023 12:38:58 PM This report has been signed electronically.

## 2023-12-02 NOTE — Patient Instructions (Addendum)

## 2023-12-02 NOTE — Progress Notes (Signed)
 History and Physical Interval Note:  12/02/2023 11:46 AM  Stephanie Mckee  has presented today for endoscopic procedure(s), with the diagnosis of  Encounter Diagnosis  Name Primary?   Special screening for malignant neoplasms, colon Yes  .  The various methods of evaluation and treatment have been discussed with the patient and/or family. After consideration of risks, benefits and other options for treatment, the patient has consented to  the endoscopic procedure(s).   The patient's history has been reviewed, patient examined, no change in status, stable for endoscopic procedure(s).  I have reviewed the patient's chart and labs.  Questions were answered to the patient's satisfaction.     Darik Massing E. Stacia, MD [facility]

## 2023-12-02 NOTE — Progress Notes (Signed)
Sedate, gd SR's, VSS, report to RN 

## 2023-12-02 NOTE — Progress Notes (Signed)
 Called to room to assist during endoscopic procedure.  Patient ID and intended procedure confirmed with present staff. Received instructions for my participation in the procedure from the performing physician.

## 2023-12-02 NOTE — Progress Notes (Signed)
 Pt's states no medical or surgical changes since previsit or office visit.

## 2023-12-03 ENCOUNTER — Telehealth: Payer: Self-pay | Admitting: *Deleted

## 2023-12-03 NOTE — Telephone Encounter (Signed)
  Follow up Call-     12/02/2023   11:19 AM  Call back number  Post procedure Call Back phone  # [phone]  Permission to leave phone message Yes     Patient questions:  Do you have a fever, pain , or abdominal swelling? No. Pain Score  0 *  Have you tolerated food without any problems? Yes.    Have you been able to return to your normal activities? Yes.    Do you have any questions about your discharge instructions: Diet   No. Medications  No. Follow up visit  No.  Do you have questions or concerns about your Care? No.  Actions: * If pain score is 4 or above: No action needed, pain <4.

## 2023-12-04 LAB — SURGICAL PATHOLOGY

## 2023-12-05 DIAGNOSIS — R5383 Other fatigue: Secondary | ICD-10-CM | POA: Diagnosis not present

## 2023-12-06 DIAGNOSIS — R0981 Nasal congestion: Secondary | ICD-10-CM | POA: Diagnosis not present

## 2023-12-09 DIAGNOSIS — M19011 Primary osteoarthritis, right shoulder: Secondary | ICD-10-CM | POA: Diagnosis not present

## 2023-12-09 DIAGNOSIS — M75121 Complete rotator cuff tear or rupture of right shoulder, not specified as traumatic: Secondary | ICD-10-CM | POA: Diagnosis not present

## 2023-12-10 ENCOUNTER — Encounter: Payer: Self-pay | Admitting: Gastroenterology

## 2023-12-10 NOTE — Progress Notes (Signed)
 Stephanie Mckee,   The polyps that I removed during your recent procedure were completely benign but were proven to be pre-cancerous polyps that MAY have grown into cancers if they had not been removed.  Studies shows that at least 20% of women over age 81 and 30% of men over age 61 have pre-cancerous polyps.  Based on current nationally recognized surveillance guidelines, I recommend that you have a repeat colonoscopy in 3 years.  Given your advanced age, I would recommend an office visit prior to scheduling your procedure, to ensure we feel that the benefits of the procedure outweigh the risks.  If you develop any new rectal bleeding, abdominal pain or significant bowel habit changes, please contact me before then.

## 2023-12-17 ENCOUNTER — Other Ambulatory Visit (HOSPITAL_COMMUNITY)
Admission: RE | Admit: 2023-12-17 | Discharge: 2023-12-17 | Disposition: A | Discharge status: Home / Self Care | Payer: Self-pay | Source: Ambulatory Visit | Attending: Oncology | Admitting: Oncology

## 2023-12-22 DIAGNOSIS — M47814 Spondylosis without myelopathy or radiculopathy, thoracic region: Secondary | ICD-10-CM | POA: Insufficient documentation

## 2023-12-23 DIAGNOSIS — Z9189 Other specified personal risk factors, not elsewhere classified: Secondary | ICD-10-CM | POA: Diagnosis not present

## 2023-12-23 DIAGNOSIS — M1611 Unilateral primary osteoarthritis, right hip: Secondary | ICD-10-CM | POA: Diagnosis not present

## 2023-12-28 LAB — GENECONNECT MOLECULAR SCREEN: Genetic Analysis Overall Interpretation: NEGATIVE

## 2023-12-31 ENCOUNTER — Ambulatory Visit: Admitting: Neurology

## 2023-12-31 ENCOUNTER — Ambulatory Visit
Admission: RE | Admit: 2023-12-31 | Discharge: 2023-12-31 | Disposition: A | Discharge status: Home / Self Care | Payer: PPO | Source: Ambulatory Visit | Attending: Family Medicine | Admitting: Family Medicine

## 2023-12-31 DIAGNOSIS — Z1231 Encounter for screening mammogram for malignant neoplasm of breast: Secondary | ICD-10-CM | POA: Diagnosis not present

## 2024-01-15 DIAGNOSIS — Z471 Aftercare following joint replacement surgery: Secondary | ICD-10-CM | POA: Diagnosis not present

## 2024-01-15 DIAGNOSIS — Z96611 Presence of right artificial shoulder joint: Secondary | ICD-10-CM | POA: Diagnosis not present

## 2024-01-15 DIAGNOSIS — Z79899 Other long term (current) drug therapy: Secondary | ICD-10-CM | POA: Diagnosis not present

## 2024-01-15 DIAGNOSIS — M19011 Primary osteoarthritis, right shoulder: Secondary | ICD-10-CM | POA: Diagnosis not present

## 2024-01-15 DIAGNOSIS — G8918 Other acute postprocedural pain: Secondary | ICD-10-CM | POA: Diagnosis not present

## 2024-01-15 DIAGNOSIS — M75101 Unspecified rotator cuff tear or rupture of right shoulder, not specified as traumatic: Secondary | ICD-10-CM | POA: Diagnosis not present

## 2024-01-15 HISTORY — PX: TOTAL SHOULDER REPLACEMENT: SUR1217

## 2024-01-20 ENCOUNTER — Ambulatory Visit (HOSPITAL_BASED_OUTPATIENT_CLINIC_OR_DEPARTMENT_OTHER): Admission: EM | Admit: 2024-01-20 | Discharge: 2024-01-20 | Disposition: A | Discharge status: Home / Self Care

## 2024-01-20 ENCOUNTER — Encounter (HOSPITAL_BASED_OUTPATIENT_CLINIC_OR_DEPARTMENT_OTHER): Payer: Self-pay | Admitting: Emergency Medicine

## 2024-01-20 DIAGNOSIS — J040 Acute laryngitis: Secondary | ICD-10-CM

## 2024-01-20 NOTE — ED Triage Notes (Addendum)
 Pt reports she can hardly talk she states she gets laryngitis every year x 2 days. Pt had right shoulder surgery 5 days ago.

## 2024-01-20 NOTE — Discharge Instructions (Signed)
 Patient had surgery on her right shoulder on 01/15/2024.  Encouraged plenty of warm fluids.  Rest of her voice.  Not even whispering.  Follow-up if symptoms do not improve, worsen or new symptoms occur.  Avoiding steroids due to recent right shoulder surgery.

## 2024-01-20 NOTE — ED Provider Notes (Signed)
 [facility]    CSN: [id number] Arrival date & time: 01/20/24  1106      History   Chief Complaint No chief complaint on file.   HPI Stephanie Mckee is a 83 y.o. female.   Patient reports that she had right shoulder surgery on 01/15/2024.  She did have general anesthesia and was intubated.  She reports chronic laryngitis once or twice a year most years.  Often associated with allergy season.  She lost her voice 2 days ago.  She has a very hoarse, raspy voice.     Past Medical History:  Diagnosis Date   Heart murmur    Osteoporosis    Thoracic degenerative disc disease    Thyroid nodule     Patient Active Problem List   Diagnosis Date Noted   Positive colorectal cancer screening using Cologuard test 11/25/2023   OP (osteoporosis) 12/31/2022   Spondylolisthesis at L4-L5 level 02/23/2021   Transient global amnesia 10/29/2019   Transient alteration of awareness 10/29/2019    Past Surgical History:  Procedure Laterality Date   CATARACT EXTRACTION Right    COLONOSCOPY     thumb surgery      OB History   No obstetric history on file.      Home Medications    Prior to Admission medications   Medication Sig Start Date End Date Taking? Authorizing Provider  aspirin EC 81 MG tablet Take 81 mg by mouth daily. 01/15/24  Yes [provider]  cetirizine (ZYRTEC) 10 MG tablet Take 10 mg by mouth daily.   Yes [provider]  Cholecalciferol (VITAMIN D) 50 MCG (2000 UT) CAPS Take 2,000 Units by mouth daily.   Yes [provider]  Cyanocobalamin (VITAMIN B-12) 5000 MCG TBDP Take 5,000 mcg by mouth daily.   Yes [provider]  gabapentin (NEURONTIN) 300 MG capsule Take 300 mg by mouth at bedtime.   Yes [provider]  Boris Lown Oil 500 MG CAPS Take 500 mg by mouth daily.   Yes [provider]  magnesium oxide (MAG-OX) 400 MG tablet Take by mouth. 11/30/21  Yes [provider]  Multiple Vitamin  (MULTIVITAMIN WITH MINERALS) TABS tablet Take 1 tablet by mouth daily.   Yes [provider]  Biotin w/ Vitamins C & E (HAIR/SKIN/NAILS PO) Take 1 tablet by mouth daily.    [provider]  cyclobenzaprine (FLEXERIL) 10 MG tablet Take 1 tablet (10 mg total) by mouth 3 (three) times daily as needed for muscle spasms. 02/24/21   Council Mechanic, NP  GLUCOSAMINE-CHONDROITIN PO Take 1 tablet by mouth daily.    [provider]  Multiple Vitamins-Minerals (PRESERVISION AREDS 2 PO) Take 1 capsule by mouth in the morning and at bedtime.    [provider]  Potassium Gluconate 550 (90 K) MG TABS Take 1 tablet by mouth daily. 11/30/21   [provider]  traMADol Janean Sark) 50 MG tablet 1 tablet as needed Orally Once a day 12/14/21   [provider]    Family History Family History  Problem Relation Age of Onset   Dementia Mother    Renal cancer Father    Bone cancer Father    Multiple myeloma Father    Healthy Sister    Breast cancer Daughter    Breast cancer Maternal Aunt     Social History Social History   Tobacco Use   Smoking status: Never   Smokeless tobacco: Never  Vaping Use   Vaping status:  Never Used  Substance Use Topics   Alcohol use: Yes    Comment: 3-4 drinks per week (wine)   Drug use: Never     Allergies   Actonel [risedronate sodium], Hydromorphone, and Prochlorperazine   Review of Systems Review of Systems  Constitutional:  Negative for chills and fever.  HENT:  Positive for voice change. Negative for ear pain and sore throat.   Eyes:  Negative for pain and visual disturbance.  Respiratory:  Negative for cough and shortness of breath.   Cardiovascular:  Negative for chest pain and palpitations.  Gastrointestinal:  Negative for abdominal pain, constipation, diarrhea, nausea and vomiting.  Genitourinary:  Negative for dysuria and hematuria.  Musculoskeletal:  Negative for arthralgias and back pain.  Skin:   Negative for color change and rash.  Neurological:  Negative for seizures and syncope.  All other systems reviewed and are negative.    Physical Exam Triage Vital Signs ED Triage Vitals  Encounter Vitals Group     BP 01/20/24 1205 114/75     Systolic BP Percentile --      Diastolic BP Percentile --      Pulse Rate 01/20/24 1205 92     Resp 01/20/24 1205 18     Temp 01/20/24 1205 98.5 F (36.9 C)     Temp Source 01/20/24 1205 Oral     SpO2 01/20/24 1205 94 %     Weight --      Height --      Head Circumference --      Peak Flow --      Pain Score 01/20/24 1202 0     Pain Loc --      Pain Education --      Exclude from Growth Chart --    No data found.  Updated Vital Signs BP 114/75 (BP Location: Left Arm)   Pulse 92   Temp 98.5 F (36.9 C) (Oral)   Resp 18   SpO2 94%   Visual Acuity Right Eye Distance:   Left Eye Distance:   Bilateral Distance:    Right Eye Near:   Left Eye Near:    Bilateral Near:     Physical Exam Vitals and nursing note reviewed.  Constitutional:      General: She is not in acute distress.    Appearance: She is well-developed. She is not ill-appearing or toxic-appearing.  HENT:     Head: Normocephalic and atraumatic.     Right Ear: Hearing, tympanic membrane, ear canal and external ear normal.     Left Ear: Hearing, tympanic membrane, ear canal and external ear normal.     Nose: Congestion and rhinorrhea present. Rhinorrhea is clear.     Right Sinus: No maxillary sinus tenderness or frontal sinus tenderness.     Left Sinus: No maxillary sinus tenderness or frontal sinus tenderness.     Mouth/Throat:     Lips: Pink.     Mouth: Mucous membranes are moist.     Pharynx: Uvula midline. No oropharyngeal exudate or posterior oropharyngeal erythema.     Tonsils: No tonsillar exudate.     Comments: Hoarse voice Eyes:     Conjunctiva/sclera: Conjunctivae normal.     Pupils: Pupils are equal, round, and reactive to light.  Cardiovascular:      Rate and Rhythm: Normal rate and regular rhythm.     Heart sounds: S1 normal and S2 normal. No murmur heard. Pulmonary:     Effort: Pulmonary effort is normal. No  respiratory distress.     Breath sounds: Normal breath sounds. No decreased breath sounds, wheezing, rhonchi or rales.  Abdominal:     General: Bowel sounds are normal.     Palpations: Abdomen is soft.     Tenderness: There is no abdominal tenderness.  Musculoskeletal:        General: No swelling.     Cervical back: Neck supple.  Lymphadenopathy:     Head:     Right side of head: No submental, submandibular, tonsillar, preauricular or posterior auricular adenopathy.     Left side of head: No submental, submandibular, tonsillar, preauricular or posterior auricular adenopathy.     Cervical: No cervical adenopathy.     Right cervical: No superficial cervical adenopathy.    Left cervical: No superficial cervical adenopathy.  Skin:    General: Skin is warm and dry.     Capillary Refill: Capillary refill takes less than 2 seconds.     Findings: No rash.  Neurological:     Mental Status: She is alert and oriented to person, place, and time.  Psychiatric:        Mood and Affect: Mood normal.      UC Treatments / Results  Labs (all labs ordered are listed, but only abnormal results are displayed) Labs Reviewed - No data to display  EKG   Radiology No results found.  Procedures Procedures (including critical care time)  Medications Ordered in UC Medications - No data to display  Initial Impression / Assessment and Plan / UC Course  I have reviewed the triage vital signs and the nursing notes.  Pertinent labs & imaging results that were available during my care of the patient were reviewed by me and considered in my medical decision making (see chart for details).     Exam is consistent with laryngitis.  She had surgery on 01/15/2024.  No steroids due to the recent right shoulder surgery.  Encouraged plenty of  warm fluids.  Rest her voice completely.  Not even whispering.  Follow-up if symptoms do not improve, worsen or new symptoms occur. Final Clinical Impressions(s) / UC Diagnoses   Final diagnoses:  Laryngitis, acute     Discharge Instructions      Patient had surgery on her right shoulder on 01/15/2024.  Encouraged plenty of warm fluids.  Rest of her voice.  Not even whispering.  Follow-up if symptoms do not improve, worsen or new symptoms occur.  Avoiding steroids due to recent right shoulder surgery.     ED Prescriptions   None    PDMP not reviewed this encounter.   Prescilla Sours, FNP 01/20/24 1255

## 2024-02-02 DIAGNOSIS — M75121 Complete rotator cuff tear or rupture of right shoulder, not specified as traumatic: Secondary | ICD-10-CM | POA: Diagnosis not present

## 2024-02-02 DIAGNOSIS — Z96611 Presence of right artificial shoulder joint: Secondary | ICD-10-CM | POA: Diagnosis not present

## 2024-02-02 DIAGNOSIS — M19011 Primary osteoarthritis, right shoulder: Secondary | ICD-10-CM | POA: Diagnosis not present

## 2024-02-02 DIAGNOSIS — Z471 Aftercare following joint replacement surgery: Secondary | ICD-10-CM | POA: Diagnosis not present

## 2024-02-02 DIAGNOSIS — Z4789 Encounter for other orthopedic aftercare: Secondary | ICD-10-CM | POA: Diagnosis not present

## 2024-02-04 ENCOUNTER — Ambulatory Visit: Payer: PPO

## 2024-02-04 VITALS — BP 99/66 | HR 85 | Temp 97.8°F | Resp 18 | Ht 65.0 in | Wt 152.6 lb

## 2024-02-04 DIAGNOSIS — M792 Neuralgia and neuritis, unspecified: Secondary | ICD-10-CM | POA: Diagnosis not present

## 2024-02-04 DIAGNOSIS — M810 Age-related osteoporosis without current pathological fracture: Secondary | ICD-10-CM | POA: Diagnosis not present

## 2024-02-04 MED ORDER — DENOSUMAB 60 MG/ML ~~LOC~~ SOSY
60.0000 mg | PREFILLED_SYRINGE | Freq: Once | SUBCUTANEOUS | Status: AC
  Administered 2024-02-04: 60 mg via SUBCUTANEOUS
  Filled 2024-02-04: qty 1

## 2024-02-04 NOTE — Progress Notes (Signed)
 Diagnosis: Osteoporosis  Provider:  Chilton Greathouse MD  Procedure: Injection  Prolia (Denosumab), Dose: 60 mg, Site: subcutaneous, Number of injections: 1  Injection Site(s): Left upper quad. abdomen  Post Care: Patient declined observation  Discharge: Condition: Good, Destination: Home . AVS Declined  Performed by:  Adriana Mccallum, RN

## 2024-02-05 DIAGNOSIS — M791 Myalgia, unspecified site: Secondary | ICD-10-CM | POA: Diagnosis not present

## 2024-02-05 DIAGNOSIS — E7800 Pure hypercholesterolemia, unspecified: Secondary | ICD-10-CM | POA: Diagnosis not present

## 2024-02-05 DIAGNOSIS — Z6825 Body mass index (BMI) 25.0-25.9, adult: Secondary | ICD-10-CM | POA: Diagnosis not present

## 2024-02-05 DIAGNOSIS — E042 Nontoxic multinodular goiter: Secondary | ICD-10-CM | POA: Diagnosis not present

## 2024-03-02 DIAGNOSIS — M19011 Primary osteoarthritis, right shoulder: Secondary | ICD-10-CM | POA: Diagnosis not present

## 2024-03-02 DIAGNOSIS — Z471 Aftercare following joint replacement surgery: Secondary | ICD-10-CM | POA: Diagnosis not present

## 2024-03-02 DIAGNOSIS — M75121 Complete rotator cuff tear or rupture of right shoulder, not specified as traumatic: Secondary | ICD-10-CM | POA: Diagnosis not present

## 2024-03-02 DIAGNOSIS — Z96611 Presence of right artificial shoulder joint: Secondary | ICD-10-CM | POA: Diagnosis not present

## 2024-03-25 DIAGNOSIS — M47814 Spondylosis without myelopathy or radiculopathy, thoracic region: Secondary | ICD-10-CM | POA: Diagnosis not present

## 2024-03-30 DIAGNOSIS — M545 Low back pain, unspecified: Secondary | ICD-10-CM | POA: Diagnosis not present

## 2024-04-06 DIAGNOSIS — M545 Low back pain, unspecified: Secondary | ICD-10-CM | POA: Diagnosis not present

## 2024-04-15 DIAGNOSIS — M545 Low back pain, unspecified: Secondary | ICD-10-CM | POA: Diagnosis not present

## 2024-04-27 DIAGNOSIS — M545 Low back pain, unspecified: Secondary | ICD-10-CM | POA: Diagnosis not present

## 2024-05-04 DIAGNOSIS — M19011 Primary osteoarthritis, right shoulder: Secondary | ICD-10-CM | POA: Diagnosis not present

## 2024-05-04 DIAGNOSIS — Z96611 Presence of right artificial shoulder joint: Secondary | ICD-10-CM | POA: Diagnosis not present

## 2024-05-04 DIAGNOSIS — M75121 Complete rotator cuff tear or rupture of right shoulder, not specified as traumatic: Secondary | ICD-10-CM | POA: Diagnosis not present

## 2024-05-09 ENCOUNTER — Ambulatory Visit (HOSPITAL_BASED_OUTPATIENT_CLINIC_OR_DEPARTMENT_OTHER)
Admission: EM | Admit: 2024-05-09 | Discharge: 2024-05-09 | Disposition: A | Discharge status: Home / Self Care | Attending: Family Medicine | Admitting: Family Medicine

## 2024-05-09 ENCOUNTER — Encounter (HOSPITAL_BASED_OUTPATIENT_CLINIC_OR_DEPARTMENT_OTHER): Payer: Self-pay | Admitting: Family Medicine

## 2024-05-09 DIAGNOSIS — N390 Urinary tract infection, site not specified: Secondary | ICD-10-CM | POA: Insufficient documentation

## 2024-05-09 DIAGNOSIS — R3915 Urgency of urination: Secondary | ICD-10-CM | POA: Diagnosis present

## 2024-05-09 DIAGNOSIS — R300 Dysuria: Secondary | ICD-10-CM

## 2024-05-09 DIAGNOSIS — R319 Hematuria, unspecified: Secondary | ICD-10-CM | POA: Insufficient documentation

## 2024-05-09 LAB — POCT URINALYSIS DIP (MANUAL ENTRY)
Glucose, UA: NEGATIVE mg/dL
Ketones, POC UA: NEGATIVE mg/dL
Nitrite, UA: NEGATIVE
Protein Ur, POC: >=300 mg/dL — AB
Spec Grav, UA: 1.025 (ref 1.010–1.025)
Urobilinogen, UA: 1 U/dL
pH, UA: 5.5 (ref 5.0–8.0)

## 2024-05-09 MED ORDER — NITROFURANTOIN MONOHYD MACRO 100 MG PO CAPS: 100.0000 mg | ORAL_CAPSULE | Freq: Two times a day (BID) | ORAL | 0 refills | Status: AC

## 2024-05-09 NOTE — ED Notes (Signed)
Provider triaged.  

## 2024-05-09 NOTE — ED Provider Notes (Signed)
 Stephanie Mckee    CSN: [id number] Arrival date & time: 05/09/24  0801      History   Chief Complaint No chief complaint on file.   HPI Stephanie Mckee is a 83 y.o. female.   83 year old female here with complaint of UTI: Patient reports on 05/08/2024, she developed frequency and urgency of urination and then she had burning of urination.  Her urine is rusty red and cloudy.  She denies fever.  She did have some diarrhea yesterday but she had the diarrhea after the urinary symptoms started not prior and the diarrhea seems better today.  She denies nausea, vomiting, constipation.     Past Medical History:  Diagnosis Date   Heart murmur    Osteoporosis    Thoracic degenerative disc disease    Thyroid  nodule     Patient Active Problem List   Diagnosis Date Noted   Positive colorectal cancer screening using Cologuard test 11/25/2023   OP (osteoporosis) 12/31/2022   Spondylolisthesis at L4-L5 level 02/23/2021   Transient global amnesia 10/29/2019   Transient alteration of awareness 10/29/2019    Past Surgical History:  Procedure Laterality Date   CATARACT EXTRACTION Right    COLONOSCOPY     thumb surgery     TOTAL SHOULDER REPLACEMENT Right 01/15/2024    OB History   No obstetric history on file.      Home Medications    Prior to Admission medications   Medication Sig Start Date End Date Taking? Authorizing Provider  nitrofurantoin, macrocrystal-monohydrate, (MACROBID) 100 MG capsule Take 1 capsule (100 mg total) by mouth 2 (two) times daily for 7 days. 05/09/24 05/16/24 Yes Ival Domino, FNP  aspirin EC 81 MG tablet Take 81 mg by mouth daily. 01/15/24   [provider]  Biotin w/ Vitamins C & E (HAIR/SKIN/NAILS PO) Take 1 tablet by mouth daily.    [provider]  cetirizine (ZYRTEC) 10 MG tablet Take 10 mg by mouth daily.    [provider]  Cholecalciferol  (VITAMIN D ) 50 MCG (2000 UT) CAPS Take 2,000 Units by mouth daily.     [provider]  Cyanocobalamin  (VITAMIN B-12) 5000 MCG TBDP Take 5,000 mcg by mouth daily.    [provider]  cyclobenzaprine  (FLEXERIL ) 10 MG tablet Take 1 tablet (10 mg total) by mouth 3 (three) times daily as needed for muscle spasms. 02/24/21   Arvil Fonda CROME, NP  gabapentin  (NEURONTIN ) 300 MG capsule Take 300 mg by mouth at bedtime.    [provider]  GLUCOSAMINE-CHONDROITIN PO Take 1 tablet by mouth daily.    [provider]  Anselm Oil 500 MG CAPS Take 500 mg by mouth daily.    [provider]  magnesium oxide (MAG-OX) 400 MG tablet Take by mouth. 11/30/21   [provider]  Multiple Vitamin (MULTIVITAMIN WITH MINERALS) TABS tablet Take 1 tablet by mouth daily.    [provider]  Multiple Vitamins-Minerals (PRESERVISION AREDS 2 PO) Take 1 capsule by mouth in the morning and at bedtime.    [provider]  Potassium Gluconate 550 (90 K) MG TABS Take 1 tablet by mouth daily. 11/30/21   [provider]  traMADol  (ULTRAM ) 50 MG tablet 1 tablet as needed Orally Once a day 12/14/21   [provider]    Family History Family History  Problem Relation Age of Onset   Dementia Mother    Renal cancer Father    Bone cancer Father  Multiple myeloma Father    Healthy Sister    Breast cancer Daughter    Breast cancer Maternal Aunt     Social History Social History   Tobacco Use   Smoking status: Never   Smokeless tobacco: Never  Vaping Use   Vaping status: Never Used  Substance Use Topics   Alcohol  use: Yes    Comment: 3-4 drinks per week (wine)   Drug use: Never     Allergies   Actonel [risedronate sodium], Hydromorphone, and Prochlorperazine   Review of Systems Review of Systems  Constitutional:  Negative for fever.  Respiratory:  Negative for cough.   Cardiovascular:  Negative for chest pain.  Gastrointestinal:  Negative for abdominal pain, constipation, diarrhea, nausea and  vomiting.  Genitourinary:  Positive for dysuria, frequency, hematuria and urgency.  Musculoskeletal:  Negative for arthralgias and back pain.  Skin:  Negative for color change and rash.  Neurological:  Negative for syncope.  All other systems reviewed and are negative.    Physical Exam Triage Vital Signs ED Triage Vitals  Encounter Vitals Group     BP      Girls Systolic BP Percentile      Girls Diastolic BP Percentile      Boys Systolic BP Percentile      Boys Diastolic BP Percentile      Pulse      Resp      Temp      Temp src      SpO2      Weight      Height      Head Circumference      Peak Flow      Pain Score      Pain Loc      Pain Education      Exclude from Growth Chart    No data found.  Updated Vital Signs BP 118/80 (BP Location: Right Arm)   Pulse 89   Temp 98.2 F (36.8 C) (Oral) Comment: vitals obtained by Birda, NP  Resp 18   SpO2 97%   Visual Acuity Right Eye Distance:   Left Eye Distance:   Bilateral Distance:    Right Eye Near:   Left Eye Near:    Bilateral Near:     Physical Exam Vitals and nursing note reviewed.  Constitutional:      General: She is not in acute distress.    Appearance: She is well-developed. She is not ill-appearing or toxic-appearing.  HENT:     Head: Normocephalic and atraumatic.     Right Ear: External ear normal.     Left Ear: External ear normal.     Nose: Nose normal.     Mouth/Throat:     Lips: Pink.     Mouth: Mucous membranes are moist.   Eyes:     Conjunctiva/sclera: Conjunctivae normal.     Pupils: Pupils are equal, round, and reactive to light.    Cardiovascular:     Rate and Rhythm: Normal rate and regular rhythm.     Heart sounds: S1 normal and S2 normal. No murmur heard. Pulmonary:     Effort: Pulmonary effort is normal. No respiratory distress.     Breath sounds: Normal breath sounds. No decreased breath sounds, wheezing, rhonchi or rales.  Abdominal:     General: Abdomen is flat. Bowel  sounds are normal.     Palpations: Abdomen is soft.     Tenderness: There is abdominal tenderness (mild) in the suprapubic area.  Musculoskeletal:        General: No swelling.   Skin:    General: Skin is warm and dry.     Capillary Refill: Capillary refill takes less than 2 seconds.     Findings: No rash.   Neurological:     Mental Status: She is alert and oriented to person, place, and time.   Psychiatric:        Mood and Affect: Mood normal.      UC Treatments / Results  Labs (all labs ordered are listed, but only abnormal results are displayed) Labs Reviewed  POCT URINALYSIS DIP (MANUAL ENTRY) - Abnormal; Notable for the following components:      Result Value   Color, UA red (*)    Clarity, UA cloudy (*)    Bilirubin, UA small (*)    Blood, UA large (*)    Protein Ur, POC >=300 (*)    Leukocytes, UA Large (3+) (*)    All other components within normal limits  URINE CULTURE    EKG   Radiology No results found.  Procedures Procedures (including critical Mckee time)  Medications Ordered in UC Medications - No data to display  Initial Impression / Assessment and Plan / UC Course  I have reviewed the triage vital signs and the nursing notes.  Pertinent labs & imaging results that were available during my Mckee of the patient were reviewed by me and considered in my medical decision making (see chart for details).  Plan of Mckee: Acute UTI: Urinalysis is abnormal.  Urine culture sent.  Will adjust the plan of Mckee, if needed once the culture results.  Nitrofurantoin 100 mg twice daily for 7 days.  Get plenty of fluids and rest.  Follow-up if symptoms do not improve, worsen or new symptoms occur.  I reviewed the plan of Mckee with the patient and/or the patient's guardian.  The patient and/or guardian had time to ask questions and acknowledged that the questions were answered.  I provided instruction on symptoms or reasons to return here or to go to an ER, if  symptoms/condition did not improve, worsened or if new symptoms occurred.  Final Clinical Impressions(s) / UC Diagnoses   Final diagnoses:  Urinary tract infection with hematuria, site unspecified  Dysuria     Discharge Instructions      Acute UTI: Nitrofurantoin 100 mg twice daily for 7 days.  Get plenty of fluids and rest.  Encouraged aloe vera, 25 mg OTC if needed for bladder pain.  Encouraged cranberry juice or lemonade to help acidify the urine.  Urine culture sent and will update the patient if the culture requires any change in the plan of Mckee.  Follow-up if symptoms do not improve, worsen or new symptoms occur.     ED Prescriptions     Medication Sig Dispense Auth. Provider   nitrofurantoin, macrocrystal-monohydrate, (MACROBID) 100 MG capsule Take 1 capsule (100 mg total) by mouth 2 (two) times daily for 7 days. 14 capsule Ival Domino, FNP      PDMP not reviewed this encounter.   Ival Domino, FNP 05/09/24 [phone]

## 2024-05-09 NOTE — Discharge Instructions (Signed)
 Acute UTI: Nitrofurantoin 100 mg twice daily for 7 days.  Get plenty of fluids and rest.  Encouraged aloe vera, 25 mg OTC if needed for bladder pain.  Encouraged cranberry juice or lemonade to help acidify the urine.  Urine culture sent and will update the patient if the culture requires any change in the plan of care.  Follow-up if symptoms do not improve, worsen or new symptoms occur.

## 2024-05-12 ENCOUNTER — Ambulatory Visit (HOSPITAL_COMMUNITY): Payer: Self-pay

## 2024-05-12 DIAGNOSIS — H524 Presbyopia: Secondary | ICD-10-CM | POA: Diagnosis not present

## 2024-05-12 DIAGNOSIS — H353132 Nonexudative age-related macular degeneration, bilateral, intermediate dry stage: Secondary | ICD-10-CM | POA: Diagnosis not present

## 2024-05-12 LAB — URINE CULTURE: Culture: >=100000 — AB

## 2024-06-01 ENCOUNTER — Encounter: Payer: Self-pay | Admitting: Endocrinology

## 2024-06-16 ENCOUNTER — Ambulatory Visit: Admitting: Podiatry

## 2024-06-16 DIAGNOSIS — L909 Atrophic disorder of skin, unspecified: Secondary | ICD-10-CM

## 2024-06-16 DIAGNOSIS — G609 Hereditary and idiopathic neuropathy, unspecified: Secondary | ICD-10-CM

## 2024-06-16 NOTE — Progress Notes (Unsigned)
 Chief Complaint  Patient presents with   Peripheral Neuropathy    NP, With neuropathy, states she is not diabetic, but takes glucosimine. Numbness has been present for a several years. Is taking Gabapent, She wants to know causes, and is there anything else she can do. No anti coag. Reports not taking ASA. No pain present.    HPI: 83 y.o. female presents today with concern of neuropathy in the bilateral lower extremity.  She has been seeing Dr. Christine in the past before he joined our practice this year.  She has been taking gabapentin  for the past couple of years.  She is not sure whether this is helping.  The neuropathy she describes is complete numbness.  Does not have any burning, pins-and-needles, uncomfortable sensations.  She did not have these types of symptoms prior to starting the gabapentin .  She does think that she had a nerve conduction study approximately 3 to 4 years ago.  Past Medical History:  Diagnosis Date   Heart murmur    Osteoporosis    Thoracic degenerative disc disease    Thyroid  nodule    Past Surgical History:  Procedure Laterality Date   CATARACT EXTRACTION Right    COLONOSCOPY     thumb surgery     TOTAL SHOULDER REPLACEMENT Right 01/15/2024   Allergies  Allergen Reactions   Actonel [Risedronate Sodium] Other (See Comments)    Decreased bone density    Hydromorphone Nausea And Vomiting   Prochlorperazine Other (See Comments)    Muscle seizure (drooling, tongue turns sideways; weird things)   Review of Systems  Neurological:  Positive for sensory change.     Physical Exam: General: The patient is alert and oriented x3 in no acute distress.  Dermatology: Skin is warm, dry and supple bilateral lower extremities. Interspaces are clear of maceration and debris.  Mild decrease in fat pad thickness on the plantar aspect of both feet.  Vascular: Palpable pedal pulses bilaterally. Capillary refill within normal limits.  No appreciable edema.  No erythema  or calor.  Neurological: Light touch, vibratory, temperature, protective sensation grossly intact bilateral feet.  No neurological abnormalities were noted on clinical exam  Musculoskeletal Exam: Mild flexible lesser toe contractures bilateral  Assessment/Plan of Care: 1. Idiopathic peripheral neuropathy   2. Fat pad atrophy of foot    Discussed clinical findings with patient today.  I will need to try to obtain her last EMG/NCV of the lower extremity.  The only thing seen in the system is an EEG.  Patient did sign a medical records release form we will try to find out which neurology facility she was last seen at.  Will reach out to the patient once I receive received results to discuss because she states no one ever told her with the findings were from that test.  If it is somewhat inconclusive, may consider ordering a new nerve conduction study of the lower extremity.  Patient instructed to begin tapering off the gabapentin  as it would typically be ineffective if she has a numb feeling in the foot.  There is not pill for neuropathy that restores sensation when there is no sensation.  She was informed that her clinical exam was within normal limits.  May need to test for any type of vitamin deficiencies or systemic causes of her symptoms as well.  Also, may entertain trying amitriptyline nightly in the future since her symptoms are worse first thing in the morning.  When she is active, she  is unaware of the neuropathy sensation.  Awanda CHARM Imperial, DPM, FACFAS [facility]     [address] [place], [place] [address]                Office [phone]  Fax [phone]

## 2024-06-18 ENCOUNTER — Other Ambulatory Visit: Payer: Self-pay | Admitting: Podiatry

## 2024-06-18 ENCOUNTER — Telehealth: Payer: Self-pay

## 2024-06-18 DIAGNOSIS — G609 Hereditary and idiopathic neuropathy, unspecified: Secondary | ICD-10-CM | POA: Diagnosis not present

## 2024-06-18 NOTE — Telephone Encounter (Signed)
-----   Message from Awanda JONETTA Imperial sent at 06/17/2024 10:23 AM EDT ----- Please reach out to the patient and let her know we were able to get her nerve conduction study from [facility].  This seemed somewhat inconclusive, but they stated it was a negative exam.  Due to the time that has passed as well as her symptoms going slightly above her ankle now, I would recommend that we repeat the EMG.  I put an order in the system so [facility] should be reaching out to her to get her set up for this test.  I also put in an order for some blood work to rule out any kind of systemic causes of the neuropathy symptoms.  She can have this performed at any [facility] facility.  The [facility] on [address] can perform this for her.  She does not need to fast for the blood test.  She does need to pick up the order at our office.  Thanks, Dr. McCaughan

## 2024-06-21 ENCOUNTER — Encounter: Payer: Self-pay | Admitting: Podiatry

## 2024-06-21 ENCOUNTER — Ambulatory Visit: Payer: Self-pay | Admitting: Podiatry

## 2024-06-23 ENCOUNTER — Other Ambulatory Visit: Payer: Self-pay | Admitting: Endocrinology

## 2024-06-23 DIAGNOSIS — E049 Nontoxic goiter, unspecified: Secondary | ICD-10-CM

## 2024-06-24 ENCOUNTER — Encounter: Payer: Self-pay | Admitting: Neurology

## 2024-06-24 ENCOUNTER — Ambulatory Visit: Admitting: Neurology

## 2024-06-24 VITALS — BP 100/62 | HR 82 | Ht 64.0 in | Wt 151.5 lb

## 2024-06-24 DIAGNOSIS — G629 Polyneuropathy, unspecified: Secondary | ICD-10-CM | POA: Diagnosis not present

## 2024-06-24 DIAGNOSIS — G459 Transient cerebral ischemic attack, unspecified: Secondary | ICD-10-CM | POA: Diagnosis not present

## 2024-06-24 DIAGNOSIS — R200 Anesthesia of skin: Secondary | ICD-10-CM | POA: Diagnosis not present

## 2024-06-24 NOTE — Progress Notes (Signed)
 [facility]  PATIENT: Stephanie Mckee DOB: 04/07/1941  REFERRING DOCTOR OR PCP:    Awanda Imperial, DPM.;  Sharlet Speaks, MD SOURCE: Patient, notes from podiatry, imaging and lab reports, MRI of the lumbar spine personally reviewed.  _________________________________   HISTORICAL  CHIEF COMPLAINT:  Chief Complaint  Patient presents with   Consult    Pt in room 11. Alone. Internal referral for numbness of bilateral feet, req. NCV/EMG. Patient reports numbness for years now.  Patient said Dr.McCaughan Podiatry referred here.  Taking gabapentin .      HISTORY OF PRESENT ILLNESS:  As the pleasure seeing patient, Stephanie Mckee, at [facility] for neurologic consultation regarding her toe numbness  She is an 83 yo woman who began to note numbness in her toes around 2020    Symptoms are mostly in her toes with numbness and not pain.   All 5 toes on bith feet are involved.   This bothers her at night but rarely when she is wearing shoed during the day.    At times, the numbness involves more of the foot but never above her ankles.     She reports having a NCV/EMG in 2020 (through [facility]?) and also had ABI's of her leg.   I cannot find these results.  She reports that symptoms are just slightly worse now than they were a few years ago when they began.  She has a h/o LBP/radicular pain and had surgery by Dr. Onetha in April 2022 (L4-L5 PLIF).  SABRA  MRI lumbar spine in 2022 showed Chronic compression fracture T11. Mild inferior endplate fracture of L2 with bone marrow edema suggesting a recent healing fracture.    Grade 1 anterolisthesis L4-5 with severe spinal stenosis at L4-5.  She has had B12 level checked and it was elevated so supplements were stopped  No h/o DM or autoimmune disorder  I had previously seen her in 2021 for transient global amnesia.  The evaluation was normal.  She has not had another episode.  Labs 06/18/2024: B12  was elevated at 1440.   ANA, Lyme, HLA B27 were negative  REVIEW OF SYSTEMS: Constitutional: No fevers, chills, sweats, or change in appetite Eyes: No visual changes, double vision, eye pain Ear, nose and throat: No hearing loss, ear pain, nasal congestion, sore throat Cardiovascular: No chest pain, palpitations Respiratory:  No shortness of breath at rest or with exertion.   No wheezes GastrointestinaI: No nausea, vomiting, diarrhea, abdominal pain, fecal incontinence Genitourinary:  No dysuria, urinary retention or frequency.  No nocturia. Musculoskeletal:  No neck pain, back pain Integumentary: No rash, pruritus, skin lesions Neurological: as above Psychiatric: No depression at this time.  No anxiety Endocrine: No palpitations, diaphoresis, change in appetite, change in weigh or increased thirst Hematologic/Lymphatic:  No anemia, purpura, petechiae. Allergic/Immunologic: No itchy/runny eyes, nasal congestion, recent allergic reactions, rashes  ALLERGIES: Allergies  Allergen Reactions   Actonel [Risedronate Sodium] Other (See Comments)    Decreased bone density    Hydromorphone Nausea And Vomiting   Prochlorperazine Other (See Comments)    Muscle seizure (drooling, tongue turns sideways; weird things)    HOME MEDICATIONS:  Current Outpatient Medications:    Apoaequorin (PREVAGEN PO), Take by mouth., Disp: , Rfl:    Biotin w/ Vitamins C & E (HAIR/SKIN/NAILS PO), Take 1 tablet by mouth daily., Disp: , Rfl:    cetirizine (ZYRTEC) 10 MG tablet, Take 10 mg by mouth daily., Disp: , Rfl:    Cholecalciferol  (  VITAMIN D ) 50 MCG (2000 UT) CAPS, Take 2,000 Units by mouth daily., Disp: , Rfl:    cyclobenzaprine  (FLEXERIL ) 10 MG tablet, Take 1 tablet (10 mg total) by mouth 3 (three) times daily as needed for muscle spasms., Disp: 30 tablet, Rfl: 0   denosumab  (PROLIA ) 60 MG/ML SOSY injection, Inject 60 mg into the skin., Disp: , Rfl:    gabapentin  (NEURONTIN ) 300 MG capsule, Take 300 mg by  mouth at bedtime., Disp: , Rfl:    GLUCOSAMINE-CHONDROITIN PO, Take 1 tablet by mouth daily., Disp: , Rfl:    Krill Oil 500 MG CAPS, Take 500 mg by mouth daily., Disp: , Rfl:    magnesium oxide (MAG-OX) 400 MG tablet, Take by mouth., Disp: , Rfl:    Multiple Vitamin (MULTIVITAMIN WITH MINERALS) TABS tablet, Take 1 tablet by mouth daily., Disp: , Rfl:    Multiple Vitamins-Minerals (PRESERVISION AREDS 2 PO), Take 1 capsule by mouth in the morning and at bedtime., Disp: , Rfl:    Potassium Gluconate 550 (90 K) MG TABS, Take 1 tablet by mouth daily., Disp: , Rfl:    traMADol  (ULTRAM ) 50 MG tablet, 1 tablet as needed Orally Once a day, Disp: , Rfl:    aspirin EC 81 MG tablet, Take 81 mg by mouth daily. (Patient not taking: Reported on 06/24/2024), Disp: , Rfl:    Cyanocobalamin  (VITAMIN B-12) 5000 MCG TBDP, Take 5,000 mcg by mouth daily. (Patient not taking: Reported on 06/24/2024), Disp: , Rfl:   PAST MEDICAL HISTORY: Past Medical History:  Diagnosis Date   Heart murmur    Osteoporosis    Thoracic degenerative disc disease    Thyroid  nodule     PAST SURGICAL HISTORY: Past Surgical History:  Procedure Laterality Date   CATARACT EXTRACTION Right    COLONOSCOPY     thumb surgery     TOTAL SHOULDER REPLACEMENT Right 01/15/2024    FAMILY HISTORY: Family History  Problem Relation Age of Onset   Dementia Mother    Renal cancer Father    Bone cancer Father    Multiple myeloma Father    Healthy Sister    Breast cancer Daughter    Breast cancer Maternal Aunt     SOCIAL HISTORY: Social History   Socioeconomic History   Marital status: Married    Spouse name: Lamar    Number of children: Not on file   Years of education: Not on file   Highest education level: Not on file  Occupational History   Not on file  Tobacco Use   Smoking status: Never   Smokeless tobacco: Never  Vaping Use   Vaping status: Never Used  Substance and Sexual Activity   Alcohol  use: Yes    Comment: 3-4  drinks per week (wine)   Drug use: Never   Sexual activity: Not on file  Other Topics Concern   Not on file  Social History Narrative   Caffeine- 1.5 cups per day    Right handed    Lives at home with husband and cat    Social Drivers of Corporate investment banker Strain: Not on file  Food Insecurity: Low Risk  (01/15/2024)   Received from [facility]   Hunger Vital Sign    Within the past 12 months, you worried that your food would run out before you got money to buy more: Never true    Within the past 12 months, the food you bought just didn't last and you didn't have money  to get more. : Never true  Transportation Needs: No Transportation Needs (01/15/2024)   Received from [facility]    In the past 12 months, has lack of reliable transportation kept you from medical appointments, meetings, work or from getting things needed for daily living? : No  Physical Activity: Not on file  Stress: Not on file  Social Connections: Not on file  Intimate Partner Violence: Not on file       PHYSICAL EXAM  Vitals:   06/24/24 0833  BP: 100/62  Pulse: 82  SpO2: 96%  Weight: 151 lb 8 oz (68.7 kg)  Height: 5' 4 (1.626 m)    Body mass index is 26 kg/m.   General: The patient is well-developed and well-nourished and in no acute distress  HEENT:  Head is Arma/AT.  Sclera are anicteric.    Neck: No carotid bruits are noted.  The neck is nontender.  Cardiovascular: The heart has a regular rate and rhythm with a normal S1 and S2. There were no murmurs, gallops or rubs.    Skin: Extremities are without rash or  edema.  Musculoskeletal:  Back is nontender  Neurologic Exam  Mental status: The patient is alert and oriented x 3 at the time of the examination. The patient has apparent normal recent and remote memory, with an apparently normal attention span and concentration ability.   Speech is normal.  Cranial nerves: Extraocular movements are full.  There is  good facial sensation to soft touch bilaterally.Facial strength is normal.  Trapezius and sternocleidomastoid strength is normal. No dysarthria is noted.  The tongue is midline, and the patient has symmetric elevation of the soft palate. No obvious hearing deficits are noted.  Motor:  Muscle bulk is normal.   Tone is normal. Strength is  5 / 5 in all 4 extremities.   Sensory: Sensory testing is intact to pinprick, soft touch and vibration sensation in the arms.  She had reduced vibration sensation at the toes (10 to 15% relative to the ankles and knees).  Coordination: Cerebellar testing reveals good finger-nose-finger and heel-to-shin bilaterally.  Gait and station: Station is normal.   Gait is normal. Tandem gait is mildly wide but normal for age. Romberg is negative.   Reflexes: Deep tendon reflexes are symmetric and normal bilaterally.     DIAGNOSTIC DATA (LABS, IMAGING, TESTING) - I reviewed patient records, labs, notes, testing and imaging myself where available.  Lab Results  Component Value Date   WBC 4.0 02/21/2021   HGB 13.5 02/21/2021   HCT 39.2 02/21/2021   MCV 89.7 02/21/2021   PLT 155 02/21/2021   No results found for: NA, K, CL, CO2, GLUCOSE, BUN, CREATININE, CALCIUM, PROT, ALBUMIN, AST, ALT, ALKPHOS, BILITOT, GFRNONAA, GFRAA No results found for: CHOL, HDL, LDLCALC, LDLDIRECT, TRIG, CHOLHDL No results found for: YHAJ8R Lab Results  Component Value Date   VITAMINB12 1,440 (H) 06/18/2024   No results found for: TSH     ASSESSMENT AND PLAN  Polyneuropathy - Plan: Multiple Myeloma Panel (SPEP&IFE w/QIG), Copper , serum, Kappa/Lambda Light Chains, Free, With Ratio, 24Hr. Urine  Numbness - Plan: Multiple Myeloma Panel (SPEP&IFE w/QIG), Copper , serum, Kappa/Lambda Light Chains, Free, With Ratio, 24Hr. Urine  Transient cerebral ischemia, unspecified type   In summary, Ms. Schuelke is an 83 year old woman with a 5-year  history of numbness in the toes that she notes mainly at night.  She does not have associated pain.  Symptoms have just slightly progressed over the  5 years and she has no numbness at or above the ankles.  The toes are equally involved.  She has loss of large fiber sensation but intact small fiber sensation consistent with a length-dependent sensory large fiber polyneuropathy.  B12 was normal.  ANA was normal.  We will check SPEP/IEF and copper  to assess for other etiologies that could cause a pure large fiber polyneuropathy.  Because of his only involvement of the toes, I do not think we will get much more information with an NCV/EMG at this time.  However, if symptoms significantly progressed we can check this to further define the polyneuropathy.  She has no pain associated with the polyneuropathy, so gabapentin  is unlikely to offer much benefit as it will not increase the sensation.  If she sleeps better with gabapentin  it could be continued.  She could try alpha lipoic acid 600 mg twice a day.  I did not schedule follow-up but have asked her to call us  if she has significant new or worsening neurologic symptoms.  Thank you for asking me to see this patient.  Please let me know if I can be of further assistance with her or other patients in the future.   Roselynne Lortz A. Vear, MD, [facility] 06/24/2024, 10:35 AM Certified in Neurology, Clinical Neurophysiology, Sleep Medicine and Neuroimaging  [facility] [address], [address] [place], [place] [address] [phone]

## 2024-06-24 NOTE — Patient Instructions (Addendum)
Alpha lipoic acid 600 mg twice a day

## 2024-06-25 LAB — LYME DISEASE SEROLOGY W/REFLEX

## 2024-06-25 LAB — HLA-B27 ANTIGEN

## 2024-06-25 LAB — VITAMIN B12: Vitamin B-12: 1440 pg/mL — ABNORMAL HIGH (ref 232–1245)

## 2024-06-25 LAB — VITAMIN D 25 HYDROXY (VIT D DEFICIENCY, FRACTURES): Vit D, 25-Hydroxy: 45.8 ng/mL (ref 30.0–100.0)

## 2024-06-25 LAB — ANA W/REFLEX: ANA Titer 1: NEGATIVE

## 2024-06-26 ENCOUNTER — Encounter: Payer: Self-pay | Admitting: Neurology

## 2024-06-28 DIAGNOSIS — R200 Anesthesia of skin: Secondary | ICD-10-CM | POA: Diagnosis not present

## 2024-06-28 DIAGNOSIS — G629 Polyneuropathy, unspecified: Secondary | ICD-10-CM | POA: Diagnosis not present

## 2024-06-29 LAB — MULTIPLE MYELOMA PANEL, SERUM
Albumin SerPl Elph-Mcnc: 3.8 g/dL (ref 2.9–4.4)
Albumin/Glob SerPl: 1.7 (ref 0.7–1.7)
Alpha 1: 0.3 g/dL (ref 0.0–0.4)
Alpha2 Glob SerPl Elph-Mcnc: 0.5 g/dL (ref 0.4–1.0)
B-Globulin SerPl Elph-Mcnc: 0.9 g/dL (ref 0.7–1.3)
Gamma Glob SerPl Elph-Mcnc: 0.6 g/dL (ref 0.4–1.8)
Globulin, Total: 2.3 g/dL (ref 2.2–3.9)
IgA/Immunoglobulin A, Serum: 116 mg/dL (ref 64–422)
IgG (Immunoglobin G), Serum: 594 mg/dL (ref 586–1602)
IgM (Immunoglobulin M), Srm: 60 mg/dL (ref 26–217)
Total Protein: 6.1 g/dL (ref 6.0–8.5)

## 2024-06-29 LAB — COPPER, SERUM: Copper: 92 ug/dL (ref 80–158)

## 2024-06-30 ENCOUNTER — Other Ambulatory Visit: Payer: Self-pay | Admitting: Neurology

## 2024-06-30 ENCOUNTER — Ambulatory Visit: Payer: Self-pay | Admitting: Neurology

## 2024-06-30 LAB — KAPPA/LAMBDA LIGHT CHAINS, FREE, WITH RATIO, 24HR. URINE
FR KAPPA LT CH,24HR: 33.79 mg/(24.h)
FR LAMBDA LT CH,24HR: 2.18 mg/(24.h)
Free Kappa Lt Chains,Ur: 28.16 mg/L (ref 1.17–86.46)
Free Lambda Lt Chains,Ur: 1.82 mg/L (ref 0.27–15.21)
Kappa/Lambda Ratio,U: 15.47 — ABNORMAL HIGH (ref 1.83–14.26)

## 2024-07-01 ENCOUNTER — Telehealth: Payer: Self-pay

## 2024-07-01 NOTE — Telephone Encounter (Signed)
 Called pt and get her scheduled for her 6 month f/u with Dr. Vear 12/27/2024 @ 1:30pm.

## 2024-07-15 DIAGNOSIS — Z6826 Body mass index (BMI) 26.0-26.9, adult: Secondary | ICD-10-CM | POA: Diagnosis not present

## 2024-07-15 DIAGNOSIS — M47814 Spondylosis without myelopathy or radiculopathy, thoracic region: Secondary | ICD-10-CM | POA: Diagnosis not present

## 2024-07-20 ENCOUNTER — Other Ambulatory Visit: Payer: Self-pay | Admitting: Student

## 2024-07-20 ENCOUNTER — Ambulatory Visit
Admission: RE | Admit: 2024-07-20 | Discharge: 2024-07-20 | Disposition: A | Discharge status: Home / Self Care | Source: Ambulatory Visit | Attending: Endocrinology | Admitting: Endocrinology

## 2024-07-20 DIAGNOSIS — E042 Nontoxic multinodular goiter: Secondary | ICD-10-CM | POA: Diagnosis not present

## 2024-07-20 DIAGNOSIS — M47814 Spondylosis without myelopathy or radiculopathy, thoracic region: Secondary | ICD-10-CM

## 2024-07-20 DIAGNOSIS — E049 Nontoxic goiter, unspecified: Secondary | ICD-10-CM

## 2024-07-21 ENCOUNTER — Ambulatory Visit
Admission: RE | Admit: 2024-07-21 | Discharge: 2024-07-21 | Disposition: A | Discharge status: Home / Self Care | Source: Ambulatory Visit | Attending: Student | Admitting: Student

## 2024-07-21 DIAGNOSIS — E210 Primary hyperparathyroidism: Secondary | ICD-10-CM | POA: Diagnosis not present

## 2024-07-21 DIAGNOSIS — E049 Nontoxic goiter, unspecified: Secondary | ICD-10-CM | POA: Diagnosis not present

## 2024-07-21 DIAGNOSIS — M47814 Spondylosis without myelopathy or radiculopathy, thoracic region: Secondary | ICD-10-CM

## 2024-07-21 DIAGNOSIS — M5124 Other intervertebral disc displacement, thoracic region: Secondary | ICD-10-CM | POA: Diagnosis not present

## 2024-07-21 DIAGNOSIS — M810 Age-related osteoporosis without current pathological fracture: Secondary | ICD-10-CM | POA: Diagnosis not present

## 2024-07-21 DIAGNOSIS — M8000XG Age-related osteoporosis with current pathological fracture, unspecified site, subsequent encounter for fracture with delayed healing: Secondary | ICD-10-CM | POA: Diagnosis not present

## 2024-07-21 DIAGNOSIS — M4804 Spinal stenosis, thoracic region: Secondary | ICD-10-CM | POA: Diagnosis not present

## 2024-08-02 DIAGNOSIS — M8000XG Age-related osteoporosis with current pathological fracture, unspecified site, subsequent encounter for fracture with delayed healing: Secondary | ICD-10-CM | POA: Diagnosis not present

## 2024-08-02 DIAGNOSIS — S22080A Wedge compression fracture of T11-T12 vertebra, initial encounter for closed fracture: Secondary | ICD-10-CM | POA: Diagnosis not present

## 2024-08-02 DIAGNOSIS — E049 Nontoxic goiter, unspecified: Secondary | ICD-10-CM | POA: Diagnosis not present

## 2024-08-02 DIAGNOSIS — E210 Primary hyperparathyroidism: Secondary | ICD-10-CM | POA: Diagnosis not present

## 2024-08-04 ENCOUNTER — Ambulatory Visit: Admitting: Podiatry

## 2024-08-10 ENCOUNTER — Ambulatory Visit (INDEPENDENT_AMBULATORY_CARE_PROVIDER_SITE_OTHER): Admitting: *Deleted

## 2024-08-10 VITALS — BP 120/79 | HR 79 | Temp 97.5°F | Resp 16 | Ht 64.0 in | Wt 152.4 lb

## 2024-08-10 DIAGNOSIS — M810 Age-related osteoporosis without current pathological fracture: Secondary | ICD-10-CM | POA: Diagnosis not present

## 2024-08-10 MED ORDER — DENOSUMAB 60 MG/ML ~~LOC~~ SOSY
60.0000 mg | PREFILLED_SYRINGE | Freq: Once | SUBCUTANEOUS | Status: AC
  Administered 2024-08-10: 60 mg via SUBCUTANEOUS
  Filled 2024-08-10: qty 1

## 2024-08-10 NOTE — Progress Notes (Signed)
 Diagnosis: Osteoporosis  Provider:  Mannam, Praveen MD  Procedure: Injection  Prolia  (Denosumab ), Dose: 60 mg, Site: subcutaneous, Number of injections: 1  Injection Site(s): Right lower quad. abdomne  Post Care: Observation period completed  Discharge: Condition: Good, Destination: Home . AVS Declined  Performed by:  Trudy Lamarr LABOR, RN

## 2024-08-17 DIAGNOSIS — Z6826 Body mass index (BMI) 26.0-26.9, adult: Secondary | ICD-10-CM | POA: Diagnosis not present

## 2024-08-17 DIAGNOSIS — M47814 Spondylosis without myelopathy or radiculopathy, thoracic region: Secondary | ICD-10-CM | POA: Diagnosis not present

## 2024-09-03 ENCOUNTER — Other Ambulatory Visit (HOSPITAL_BASED_OUTPATIENT_CLINIC_OR_DEPARTMENT_OTHER): Payer: Self-pay

## 2024-09-03 ENCOUNTER — Ambulatory Visit (HOSPITAL_BASED_OUTPATIENT_CLINIC_OR_DEPARTMENT_OTHER)
Admission: EM | Admit: 2024-09-03 | Discharge: 2024-09-03 | Disposition: A | Discharge status: Home / Self Care | Attending: Family Medicine | Admitting: Family Medicine

## 2024-09-03 ENCOUNTER — Encounter (HOSPITAL_BASED_OUTPATIENT_CLINIC_OR_DEPARTMENT_OTHER): Payer: Self-pay

## 2024-09-03 ENCOUNTER — Encounter: Payer: Self-pay | Admitting: Endocrinology

## 2024-09-03 DIAGNOSIS — J209 Acute bronchitis, unspecified: Secondary | ICD-10-CM | POA: Diagnosis not present

## 2024-09-03 DIAGNOSIS — R051 Acute cough: Secondary | ICD-10-CM

## 2024-09-03 LAB — POC SOFIA SARS ANTIGEN FIA: SARS Coronavirus 2 Ag: NEGATIVE

## 2024-09-03 MED ORDER — PREDNISONE 20 MG PO TABS
20.0000 mg | ORAL_TABLET | Freq: Every day | ORAL | 0 refills | Status: AC
  Filled 2024-09-03: qty 5, 5d supply, fill #0

## 2024-09-03 MED ORDER — BENZONATATE 200 MG PO CAPS
200.0000 mg | ORAL_CAPSULE | Freq: Three times a day (TID) | ORAL | 0 refills | Status: AC | PRN
  Filled 2024-09-03: qty 20, 7d supply, fill #0

## 2024-09-03 MED ORDER — ALBUTEROL SULFATE HFA 108 (90 BASE) MCG/ACT IN AERS
2.0000 | INHALATION_SPRAY | RESPIRATORY_TRACT | 0 refills | Status: AC | PRN
  Filled 2024-09-03: qty 6.7, 17d supply, fill #0

## 2024-09-03 NOTE — Discharge Instructions (Addendum)
 Acute viral bronchitis with cough: Rapid COVID is negative.  Albuterol inhaler, 2 puffs, every 4 hours if needed for wheezing.  Prednisone, 20 mg, 1 daily for 5 days.  Get plenty of fluids and rest.  Due to allergies, patient is not a candidate for Promethazine DM.  Use benzonatate, 200 mg, every 8 hours if needed for cough.  Follow-up if symptoms do not improve, worsen or new symptoms occur.

## 2024-09-03 NOTE — ED Triage Notes (Signed)
 Pt c/o nasal congestion/drainage and cough-occasionally productive. Pt denies fever, chills, body aches, or sore throat. States she recently traveled to [place]  and it seemed to irritate her allergies. She was having itchy irritated eyes but that has since resolved. Pt has been taking zyrtec and sudafed with some relief.

## 2024-09-03 NOTE — ED Provider Notes (Signed)
 [facility]    CSN: [id number] Arrival date & time: 09/03/24  0947      History   Chief Complaint Chief Complaint  Patient presents with   Nasal Congestion   Cough    HPI Nesiah Jump Streat is a 83 y.o. female.   83 year old female who reports that she was on a trip on a bus to [place] .  She feels like she had allergy issues because soon as I got off the bus and started touring she had itchy watery eyes and a runny nose.  Symptoms started on approximately 09/01/2024 or earlier.  She does have a wet, productive cough.  Mostly what she is coughing up is white but sometimes it is yellow or even dark yellow.  She denies fever, chills, body aches, sore throat.  She has used OTC Zyrtec and also Sudafed and both have provided some relief of her symptoms.  The eye symptoms have completely resolved since she got home.   Cough Associated symptoms: rhinorrhea   Associated symptoms: no chest pain, no chills, no ear pain, no fever, no rash, no shortness of breath and no sore throat     Past Medical History:  Diagnosis Date   Heart murmur    Osteoporosis    Thoracic degenerative disc disease    Thyroid  nodule     Patient Active Problem List   Diagnosis Date Noted   S/P reverse total shoulder arthroplasty, right 01/15/2024   Thoracic spondylosis 12/22/2023   Nontraumatic complete tear of right rotator cuff 12/09/2023   Positive colorectal cancer screening using Cologuard test 11/25/2023   OP (osteoporosis) 12/31/2022   S/P reverse total shoulder arthroplasty, left 12/13/2021   Spondylolisthesis at L4-L5 level 02/23/2021   Lumbago of lumbar region with sciatica 11/28/2020   Neuropathy 08/15/2020   Transient global amnesia 10/29/2019   Transient alteration of awareness 10/29/2019   Idiopathic scoliosis and kyphoscoliosis 04/21/2018   Post-traumatic osteoarthritis of first carpometacarpal joint of right hand 01/22/2016   Disorder of sacroiliac joint 09/28/2015    Degeneration of lumbosacral intervertebral disc 02/28/2015   Displacement of lumbar intervertebral disc without myelopathy 09/23/2013    Past Surgical History:  Procedure Laterality Date   CATARACT EXTRACTION Right    COLONOSCOPY     thumb surgery     TOTAL SHOULDER REPLACEMENT Right 01/15/2024    OB History   No obstetric history on file.      Home Medications    Prior to Admission medications   Medication Sig Start Date End Date Taking? Authorizing Provider  albuterol (VENTOLIN HFA) 108 (90 Base) MCG/ACT inhaler Inhale 2 puffs into the lungs every 4 (four) hours as needed for wheezing or shortness of breath. 09/03/24  Yes Ival Domino, FNP  benzonatate (TESSALON) 200 MG capsule Take 1 capsule (200 mg total) by mouth 3 (three) times daily as needed for cough. 09/03/24  Yes Ival Domino, FNP  predniSONE (DELTASONE) 20 MG tablet Take 1 tablet (20 mg total) by mouth daily with breakfast for 5 days. 09/03/24 09/08/24 Yes Ival Domino, FNP  Apoaequorin (PREVAGEN PO) Take by mouth.    [provider]  Biotin w/ Vitamins C & E (HAIR/SKIN/NAILS PO) Take 1 tablet by mouth daily.    [provider]  cetirizine (ZYRTEC) 10 MG tablet Take 10 mg by mouth daily.    [provider]  Cholecalciferol  (VITAMIN D ) 50 MCG (2000 UT) CAPS Take 2,000 Units by mouth daily.    [provider]  cyclobenzaprine  (FLEXERIL )  10 MG tablet Take 1 tablet (10 mg total) by mouth 3 (three) times daily as needed for muscle spasms. 02/24/21   Arvil Fonda CROME, NP  denosumab  (PROLIA ) 60 MG/ML SOSY injection Inject 60 mg into the skin.    [provider]  gabapentin  (NEURONTIN ) 300 MG capsule Take 300 mg by mouth at bedtime.    [provider]  GLUCOSAMINE-CHONDROITIN PO Take 1 tablet by mouth daily.    [provider]  Anselm Oil 500 MG CAPS Take 500 mg by mouth daily.    [provider]  magnesium oxide (MAG-OX) 400 MG tablet Take by mouth.  11/30/21   [provider]  Multiple Vitamin (MULTIVITAMIN WITH MINERALS) TABS tablet Take 1 tablet by mouth daily.    [provider]  Multiple Vitamins-Minerals (PRESERVISION AREDS 2 PO) Take 1 capsule by mouth in the morning and at bedtime.    [provider]  Potassium Gluconate 550 (90 K) MG TABS Take 1 tablet by mouth daily. 11/30/21   [provider]  traMADol  (ULTRAM ) 50 MG tablet 1 tablet as needed Orally Once a day 12/14/21   [provider]    Family History Family History  Problem Relation Age of Onset   Dementia Mother    Renal cancer Father    Bone cancer Father    Multiple myeloma Father    Healthy Sister    Breast cancer Daughter    Breast cancer Maternal Aunt     Social History Social History   Tobacco Use   Smoking status: Never   Smokeless tobacco: Never  Vaping Use   Vaping status: Never Used  Substance Use Topics   Alcohol  use: Yes    Comment: 3-4 drinks per week (wine)   Drug use: Never     Allergies   Actonel [risedronate sodium], Hydromorphone, and Prochlorperazine   Review of Systems Review of Systems  Constitutional:  Negative for chills and fever.  HENT:  Positive for congestion, postnasal drip and rhinorrhea. Negative for ear pain and sore throat.   Eyes:  Negative for pain and visual disturbance.  Respiratory:  Positive for cough. Negative for shortness of breath.   Cardiovascular:  Negative for chest pain and palpitations.  Gastrointestinal:  Negative for abdominal pain, constipation, diarrhea, nausea and vomiting.  Genitourinary:  Negative for dysuria and hematuria.  Musculoskeletal:  Negative for arthralgias and back pain.  Skin:  Negative for color change and rash.  Neurological:  Negative for seizures and syncope.  All other systems reviewed and are negative.    Physical Exam Triage Vital Signs ED Triage Vitals  Encounter Vitals Group     BP 09/03/24 1119 126/84     Girls Systolic BP  Percentile --      Girls Diastolic BP Percentile --      Boys Systolic BP Percentile --      Boys Diastolic BP Percentile --      Pulse Rate 09/03/24 1119 76     Resp 09/03/24 1119 20     Temp 09/03/24 1119 (!) 97.5 F (36.4 C)     Temp Source 09/03/24 1119 Oral     SpO2 09/03/24 1119 95 %     Weight --      Height --      Head Circumference --      Peak Flow --      Pain Score 09/03/24 1118 0     Pain Loc --      Pain  Education --      Exclude from [employer] Chart --    No data found.  Updated Vital Signs BP 126/84 (BP Location: Right Arm)   Pulse 76   Temp (!) 97.5 F (36.4 C) (Oral)   Resp 20   SpO2 95%   Visual Acuity Right Eye Distance:   Left Eye Distance:   Bilateral Distance:    Right Eye Near:   Left Eye Near:    Bilateral Near:     Physical Exam Vitals and nursing note reviewed.  Constitutional:      General: She is not in acute distress.    Appearance: She is well-developed. She is not ill-appearing, toxic-appearing or diaphoretic.  HENT:     Head: Normocephalic and atraumatic.     Right Ear: Hearing, tympanic membrane, ear canal and external ear normal.     Left Ear: Hearing, tympanic membrane, ear canal and external ear normal.     Nose: Congestion and rhinorrhea present. Rhinorrhea is clear.     Right Sinus: No maxillary sinus tenderness or frontal sinus tenderness.     Left Sinus: No maxillary sinus tenderness or frontal sinus tenderness.     Mouth/Throat:     Lips: Pink.     Mouth: Mucous membranes are moist.     Pharynx: Uvula midline. No oropharyngeal exudate or posterior oropharyngeal erythema.     Tonsils: No tonsillar exudate.  Eyes:     Conjunctiva/sclera: Conjunctivae normal.     Pupils: Pupils are equal, round, and reactive to light.  Cardiovascular:     Rate and Rhythm: Normal rate and regular rhythm.     Heart sounds: S1 normal and S2 normal. No murmur heard. Pulmonary:     Effort: Pulmonary effort is normal. No respiratory  distress.     Breath sounds: Examination of the right-upper field reveals wheezing. Examination of the left-upper field reveals wheezing. Wheezing (Rare, intermittent inspiratory wheezing.) present. No decreased breath sounds, rhonchi or rales.  Abdominal:     General: Bowel sounds are normal.     Palpations: Abdomen is soft.     Tenderness: There is no abdominal tenderness.  Musculoskeletal:        General: No swelling.     Cervical back: Neck supple.  Lymphadenopathy:     Head:     Right side of head: No submental, submandibular, tonsillar, preauricular or posterior auricular adenopathy.     Left side of head: No submental, submandibular, tonsillar, preauricular or posterior auricular adenopathy.     Cervical: No cervical adenopathy.     Right cervical: No superficial cervical adenopathy.    Left cervical: No superficial cervical adenopathy.  Skin:    General: Skin is warm and dry.     Capillary Refill: Capillary refill takes less than 2 seconds.     Findings: No rash.  Neurological:     Mental Status: She is alert and oriented to person, place, and time.  Psychiatric:        Mood and Affect: Mood normal.      UC Treatments / Results  Labs (all labs ordered are listed, but only abnormal results are displayed) Labs Reviewed  POC SOFIA SARS ANTIGEN FIA - Normal    EKG   Radiology No results found.  Procedures Procedures (including critical care time)  Medications Ordered in UC Medications - No data to display  Initial Impression / Assessment and Plan / UC Course  I have reviewed the triage vital signs and the nursing  notes.  Pertinent labs & imaging results that were available during my care of the patient were reviewed by me and considered in my medical decision making (see chart for details).  Plan of Care: Acute viral bronchitis with cough: Rapid COVID is negative.  Albuterol inhaler, 2 puffs, every 4 hours if needed for wheezing.  Prednisone, 20 mg, 1 daily  for 5 days.  Get plenty of fluids and rest.  Due to allergies, patient is not a candidate for Promethazine DM.  Use benzonatate, 200 mg, every 8 hours if needed for cough.  Given the American [employer] Association handout on how to use a metered-dose inhaler.  Follow-up if symptoms do not improve, worsen or new symptoms occur.  I reviewed the plan of care with the patient and/or the patient's guardian.  The patient and/or guardian had time to ask questions and acknowledged that the questions were answered.  I provided instruction on symptoms or reasons to return here or to go to an ER, if symptoms/condition did not improve, worsened or if new symptoms occurred.  Final Clinical Impressions(s) / UC Diagnoses   Final diagnoses:  Acute cough  Acute bronchitis, unspecified organism     Discharge Instructions      Acute viral bronchitis with cough: Rapid COVID is negative.  Albuterol inhaler, 2 puffs, every 4 hours if needed for wheezing.  Prednisone, 20 mg, 1 daily for 5 days.  Get plenty of fluids and rest.  Due to allergies, patient is not a candidate for Promethazine DM.  Use benzonatate, 200 mg, every 8 hours if needed for cough.  Follow-up if symptoms do not improve, worsen or new symptoms occur.     ED Prescriptions     Medication Sig Dispense Auth. Provider   albuterol (VENTOLIN HFA) 108 (90 Base) MCG/ACT inhaler Inhale 2 puffs into the lungs every 4 (four) hours as needed for wheezing or shortness of breath. 6.7 g Ival Domino, FNP   predniSONE (DELTASONE) 20 MG tablet Take 1 tablet (20 mg total) by mouth daily with breakfast for 5 days. 5 tablet Selim Durden, FNP   benzonatate (TESSALON) 200 MG capsule Take 1 capsule (200 mg total) by mouth 3 (three) times daily as needed for cough. 20 capsule Ival Domino, FNP      PDMP not reviewed this encounter.   Ival Domino, FNP 09/03/24 1218

## 2024-09-06 DIAGNOSIS — M47814 Spondylosis without myelopathy or radiculopathy, thoracic region: Secondary | ICD-10-CM | POA: Diagnosis not present

## 2024-09-07 DIAGNOSIS — M19012 Primary osteoarthritis, left shoulder: Secondary | ICD-10-CM | POA: Diagnosis not present

## 2024-09-07 DIAGNOSIS — Z96611 Presence of right artificial shoulder joint: Secondary | ICD-10-CM | POA: Diagnosis not present

## 2024-09-07 DIAGNOSIS — M19011 Primary osteoarthritis, right shoulder: Secondary | ICD-10-CM | POA: Diagnosis not present

## 2024-09-07 DIAGNOSIS — Z96612 Presence of left artificial shoulder joint: Secondary | ICD-10-CM | POA: Diagnosis not present

## 2024-09-07 DIAGNOSIS — M75121 Complete rotator cuff tear or rupture of right shoulder, not specified as traumatic: Secondary | ICD-10-CM | POA: Diagnosis not present

## 2024-09-08 DIAGNOSIS — D485 Neoplasm of uncertain behavior of skin: Secondary | ICD-10-CM | POA: Diagnosis not present

## 2024-09-08 DIAGNOSIS — L821 Other seborrheic keratosis: Secondary | ICD-10-CM | POA: Diagnosis not present

## 2024-09-08 DIAGNOSIS — L905 Scar conditions and fibrosis of skin: Secondary | ICD-10-CM | POA: Diagnosis not present

## 2024-09-08 DIAGNOSIS — D692 Other nonthrombocytopenic purpura: Secondary | ICD-10-CM | POA: Diagnosis not present

## 2024-09-08 DIAGNOSIS — L820 Inflamed seborrheic keratosis: Secondary | ICD-10-CM | POA: Diagnosis not present

## 2024-09-08 DIAGNOSIS — Z85828 Personal history of other malignant neoplasm of skin: Secondary | ICD-10-CM | POA: Diagnosis not present

## 2024-09-08 DIAGNOSIS — L570 Actinic keratosis: Secondary | ICD-10-CM | POA: Diagnosis not present

## 2024-09-20 ENCOUNTER — Other Ambulatory Visit (HOSPITAL_COMMUNITY): Payer: Self-pay | Admitting: Pharmacy Technician

## 2024-09-20 DIAGNOSIS — M47814 Spondylosis without myelopathy or radiculopathy, thoracic region: Secondary | ICD-10-CM | POA: Diagnosis not present

## 2024-10-11 DIAGNOSIS — M47814 Spondylosis without myelopathy or radiculopathy, thoracic region: Secondary | ICD-10-CM | POA: Diagnosis not present

## 2024-10-18 ENCOUNTER — Encounter (HOSPITAL_BASED_OUTPATIENT_CLINIC_OR_DEPARTMENT_OTHER): Payer: Self-pay | Admitting: Emergency Medicine

## 2024-10-18 ENCOUNTER — Other Ambulatory Visit (HOSPITAL_BASED_OUTPATIENT_CLINIC_OR_DEPARTMENT_OTHER): Payer: Self-pay

## 2024-10-18 ENCOUNTER — Ambulatory Visit (HOSPITAL_BASED_OUTPATIENT_CLINIC_OR_DEPARTMENT_OTHER)
Admission: EM | Admit: 2024-10-18 | Discharge: 2024-10-18 | Disposition: A | Discharge status: Home / Self Care | Attending: Family Medicine | Admitting: Family Medicine

## 2024-10-18 DIAGNOSIS — M5489 Other dorsalgia: Secondary | ICD-10-CM | POA: Insufficient documentation

## 2024-10-18 DIAGNOSIS — M545 Low back pain, unspecified: Secondary | ICD-10-CM | POA: Diagnosis not present

## 2024-10-18 DIAGNOSIS — M546 Pain in thoracic spine: Secondary | ICD-10-CM | POA: Insufficient documentation

## 2024-10-18 LAB — POCT URINE DIPSTICK
Bilirubin, UA: NEGATIVE
Glucose, UA: NEGATIVE mg/dL
Ketones, POC UA: NEGATIVE mg/dL
Leukocytes, UA: NEGATIVE
Nitrite, UA: NEGATIVE
POC PROTEIN,UA: NEGATIVE
Spec Grav, UA: <=1.005 — AB (ref 1.010–1.025)
Urobilinogen, UA: 0.2 U/dL
pH, UA: 5.5 (ref 5.0–8.0)

## 2024-10-18 MED ORDER — TRAMADOL HCL 50 MG PO TABS: 50.0000 mg | ORAL_TABLET | Freq: Two times a day (BID) | ORAL | 0 refills | Status: DC | PRN

## 2024-10-18 MED ORDER — DEXAMETHASONE SOD PHOSPHATE PF 10 MG/ML IJ SOLN
10.0000 mg | Freq: Once | INTRAMUSCULAR | Status: AC
  Administered 2024-10-18: 10 mg via INTRAMUSCULAR

## 2024-10-18 MED ORDER — TRAMADOL HCL 50 MG PO TABS
50.0000 mg | ORAL_TABLET | Freq: Two times a day (BID) | ORAL | 0 refills | Status: AC | PRN
  Filled 2024-10-18: qty 14, 7d supply, fill #0

## 2024-10-18 NOTE — ED Provider Notes (Signed)
 [facility]    CSN: [id number] Arrival date & time: 10/18/24  9173      History   Chief Complaint Chief Complaint  Patient presents with   Back Pain    HPI Stephanie Mckee is a 83 y.o. female.   Pt is an 83 year old female that presents with back pain. Pt st's she had a nerve ablation one week ago due to back pain.  Pt st's since then the pain has gotten worse.  St's she called her Neuro MD and was sent a Rx for Flexeril  which she was already taking but it was not helping. She has been taking tramadol  which helps some. No numbness, tingling or weakness in extremities. She has been using heat to the area also ans tens unit. No urinary complaints.    Back Pain   Past Medical History:  Diagnosis Date   Heart murmur    Osteoporosis    Thoracic degenerative disc disease    Thyroid  nodule     Patient Active Problem List   Diagnosis Date Noted   S/P reverse total shoulder arthroplasty, right 01/15/2024   Thoracic spondylosis 12/22/2023   Nontraumatic complete tear of right rotator cuff 12/09/2023   Positive colorectal cancer screening using Cologuard test 11/25/2023   OP (osteoporosis) 12/31/2022   S/P reverse total shoulder arthroplasty, left 12/13/2021   Spondylolisthesis at L4-L5 level 02/23/2021   Lumbago of lumbar region with sciatica 11/28/2020   Neuropathy 08/15/2020   Transient global amnesia 10/29/2019   Transient alteration of awareness 10/29/2019   Idiopathic scoliosis and kyphoscoliosis 04/21/2018   Post-traumatic osteoarthritis of first carpometacarpal joint of right hand 01/22/2016   Disorder of sacroiliac joint 09/28/2015   Degeneration of lumbosacral intervertebral disc 02/28/2015   Displacement of lumbar intervertebral disc without myelopathy 09/23/2013    Past Surgical History:  Procedure Laterality Date   CATARACT EXTRACTION Right    COLONOSCOPY     thumb surgery     TOTAL SHOULDER REPLACEMENT Right 01/15/2024    OB History    No obstetric history on file.      Home Medications    Prior to Admission medications   Medication Sig Start Date End Date Taking? Authorizing Provider  albuterol  (VENTOLIN  HFA) 108 (90 Base) MCG/ACT inhaler Inhale 2 puffs into the lungs every 4 (four) hours as needed for wheezing or shortness of breath. 09/03/24   Ival Domino, FNP  Apoaequorin (PREVAGEN PO) Take by mouth.    [provider]  benzonatate  (TESSALON ) 200 MG capsule Take 1 capsule (200 mg total) by mouth 3 (three) times daily as needed for cough. 09/03/24   Ival Domino, FNP  Biotin w/ Vitamins C & E (HAIR/SKIN/NAILS PO) Take 1 tablet by mouth daily.    [provider]  cetirizine (ZYRTEC) 10 MG tablet Take 10 mg by mouth daily.    [provider]  Cholecalciferol  (VITAMIN D ) 50 MCG (2000 UT) CAPS Take 2,000 Units by mouth daily.    [provider]  cyclobenzaprine  (FLEXERIL ) 10 MG tablet Take 1 tablet (10 mg total) by mouth 3 (three) times daily as needed for muscle spasms. 02/24/21   Arvil Fonda CROME, NP  denosumab  (PROLIA ) 60 MG/ML SOSY injection Inject 60 mg into the skin.    [provider]  gabapentin  (NEURONTIN ) 300 MG capsule Take 300 mg by mouth at bedtime.    [provider]  GLUCOSAMINE-CHONDROITIN PO Take 1 tablet by mouth daily.    [provider]  Krill Oil 500 MG CAPS Take 500 mg by mouth daily.    [provider]  magnesium oxide (MAG-OX) 400 MG tablet Take by mouth. 11/30/21   [provider]  Multiple Vitamin (MULTIVITAMIN WITH MINERALS) TABS tablet Take 1 tablet by mouth daily.    [provider]  Multiple Vitamins-Minerals (PRESERVISION AREDS 2 PO) Take 1 capsule by mouth in the morning and at bedtime.    [provider]  Potassium Gluconate 550 (90 K) MG TABS Take 1 tablet by mouth daily. 11/30/21   [provider]  traMADol  (ULTRAM ) 50 MG tablet Take 1 tablet (50 mg total) by mouth every 12  (twelve) hours as needed for up to 7 days. 10/18/24 10/25/24  Adah Wilbert LABOR, FNP    Family History Family History  Problem Relation Age of Onset   Dementia Mother    Renal cancer Father    Bone cancer Father    Multiple myeloma Father    Healthy Sister    Breast cancer Daughter    Breast cancer Maternal Aunt     Social History Social History   Tobacco Use   Smoking status: Never   Smokeless tobacco: Never  Vaping Use   Vaping status: Never Used  Substance Use Topics   Alcohol  use: Yes    Comment: 3-4 drinks per week (wine)   Drug use: Never     Allergies   Actonel [risedronate sodium], Hydromorphone, and Prochlorperazine   Review of Systems Review of Systems  Musculoskeletal:  Positive for back pain.     Physical Exam Triage Vital Signs ED Triage Vitals  Encounter Vitals Group     BP 10/18/24 0848 (!) 145/90     Girls Systolic BP Percentile --      Girls Diastolic BP Percentile --      Boys Systolic BP Percentile --      Boys Diastolic BP Percentile --      Pulse Rate 10/18/24 0848 86     Resp 10/18/24 0848 16     Temp 10/18/24 0848 97.8 F (36.6 C)     Temp Source 10/18/24 0848 Oral     SpO2 10/18/24 0848 97 %     Weight 10/18/24 0849 144 lb (65.3 kg)     Height 10/18/24 0849 5' 5 (1.651 m)     Head Circumference --      Peak Flow --      Pain Score 10/18/24 0848 10     Pain Loc --      Pain Education --      Exclude from Growth Chart --    No data found.  Updated Vital Signs BP (!) 145/90 (BP Location: Right Arm)   Pulse 86   Temp 97.8 F (36.6 C) (Oral)   Resp 16   Ht 5' 5 (1.651 m)   Wt 144 lb (65.3 kg)   SpO2 97%   BMI 23.96 kg/m   Visual Acuity Right Eye Distance:   Left Eye Distance:   Bilateral Distance:    Right Eye Near:   Left Eye Near:    Bilateral Near:     Physical Exam Vitals and nursing note reviewed.  Constitutional:      General: She is not in acute distress.    Appearance: Normal appearance. She is not  ill-appearing, toxic-appearing or diaphoretic.  Pulmonary:     Effort: Pulmonary effort is normal.  Musculoskeletal:       Back:  Skin:  General: Skin is warm and dry.  Neurological:     Mental Status: She is alert.  Psychiatric:        Mood and Affect: Mood normal.      UC Treatments / Results  Labs (all labs ordered are listed, but only abnormal results are displayed) Labs Reviewed  POCT URINE DIPSTICK - Abnormal; Notable for the following components:      Result Value   Spec Grav, UA <=1.005 (*)    Blood, UA trace-intact (*)    All other components within normal limits  URINE CULTURE    EKG   Radiology No results found.  Procedures Procedures (including critical care time)  Medications Ordered in UC Medications  dexamethasone  (DECADRON ) injection 10 mg (has no administration in time range)    Initial Impression / Assessment and Plan / UC Course  I have reviewed the triage vital signs and the nursing notes.  Pertinent labs & imaging results that were available during my care of the patient were reviewed by me and considered in my medical decision making (see chart for details).     Thoracic back pain-patient with chronic back pain and had recent nerve ablation about a week ago. Urine with trace blood. Sending for culture.  Reports was told it may get worse before better.  She is in a lot of pain and taking over-the-counter anti-inflammatories, tramadol , using TENS unit and heat with  minimal relief.  Will go ahead and give her a steroid injection here today to hopefully help with pain and inflammation.  She can continue tramadol  and extra Tylenol  as needed. Recommend follow-up with the neurosurgeon for continued problems Final Clinical Impressions(s) / UC Diagnoses   Final diagnoses:  Acute low back pain without sciatica, unspecified back pain laterality  Other acute back pain  Acute right-sided thoracic back pain     Discharge Instructions       Steroid injection given here today to hopefully help with pain and inflammation.  Can continue Exer strength Tylenol  and tramadol  for more severe pain as needed.  Please follow-up with your doctor for any continued issues     ED Prescriptions     Medication Sig Dispense Auth. Provider   traMADol  (ULTRAM ) 50 MG tablet Take 1 tablet (50 mg total) by mouth every 12 (twelve) hours as needed for up to 7 days. 14 tablet Jontavius Rabalais A, FNP      I have reviewed the PDMP during this encounter.   Adah Wilbert LABOR, FNP 10/18/24 [phone]

## 2024-10-18 NOTE — Discharge Instructions (Signed)
 Steroid injection given here today to hopefully help with pain and inflammation.  Can continue Exer strength Tylenol  and tramadol  for more severe pain as needed.  Please follow-up with your doctor for any continued issues

## 2024-10-18 NOTE — ED Triage Notes (Signed)
 Pt st's she had a nerve ablation one week ago due to back pain.  Pt st's since then the pain has gotten worse.  St's she called her Neuro MD and was sent a Rx for Flexeril  which she was already taking but it was not helping   Pt c/o pain to mid back

## 2024-10-19 LAB — URINE CULTURE: Culture: <10000 — AB

## 2024-10-20 ENCOUNTER — Ambulatory Visit (HOSPITAL_COMMUNITY): Payer: Self-pay

## 2024-11-01 ENCOUNTER — Telehealth: Payer: Self-pay | Admitting: Urology

## 2024-11-01 ENCOUNTER — Other Ambulatory Visit: Payer: Self-pay | Admitting: Podiatry

## 2024-11-01 MED ORDER — GABAPENTIN 300 MG PO CAPS: 300.0000 mg | ORAL_CAPSULE | Freq: Every day | ORAL | 4 refills | Status: AC

## 2024-11-01 NOTE — Telephone Encounter (Signed)
 Pt called wanting to know if you could refill her Gabapentin  300mg .   [facility] in [place], [place]  Called back # [phone]

## 2024-11-22 ENCOUNTER — Telehealth: Payer: Self-pay

## 2024-11-22 NOTE — Telephone Encounter (Signed)
 Auth Submission: NO AUTH NEEDED Site of care: Site of care: [facility] Payer: hta ppo Medication & CPT/J Code(s) submitted: Prolia  (Denosumab ) [id number] Diagnosis Code:  Route of submission (phone, fax, portal): phone Phone # Fax # Auth type: Buy/Bill PB Units/visits requested: 60mg  q79months Reference number:  Approval from: 11/22/24 to 11/17/25

## 2024-11-24 ENCOUNTER — Other Ambulatory Visit: Payer: Self-pay | Admitting: Student

## 2024-11-24 DIAGNOSIS — M47814 Spondylosis without myelopathy or radiculopathy, thoracic region: Secondary | ICD-10-CM

## 2024-11-26 ENCOUNTER — Ambulatory Visit
Admission: RE | Admit: 2024-11-26 | Discharge: 2024-11-26 | Disposition: A | Source: Ambulatory Visit | Attending: Student

## 2024-11-26 DIAGNOSIS — M47814 Spondylosis without myelopathy or radiculopathy, thoracic region: Secondary | ICD-10-CM

## 2024-11-30 ENCOUNTER — Institutional Professional Consult (permissible substitution): Admitting: Neurology

## 2024-12-27 ENCOUNTER — Ambulatory Visit: Admitting: Neurology

## 2025-02-08 ENCOUNTER — Ambulatory Visit
